# Patient Record
Sex: Male | Born: 1975 | Race: White | Hispanic: No | Marital: Married | Smoking: Former smoker
Health system: Southern US, Community
[De-identification: ages and names within clinical notes are randomized; demographics above are authoritative.]

## PROBLEM LIST (undated history)

## (undated) DIAGNOSIS — F319 Bipolar disorder, unspecified: Secondary | ICD-10-CM

## (undated) DIAGNOSIS — G4730 Sleep apnea, unspecified: Secondary | ICD-10-CM

## (undated) HISTORY — PX: UVULOPALATOPHARYNGOPLASTY: SHX827

## (undated) HISTORY — PX: VASECTOMY: SHX75

---

## 1998-08-04 ENCOUNTER — Emergency Department (HOSPITAL_COMMUNITY): Admission: EM | Admit: 1998-08-04 | Discharge: 1998-08-04 | Payer: Self-pay | Admitting: Emergency Medicine

## 1998-08-04 ENCOUNTER — Encounter: Payer: Self-pay | Admitting: Emergency Medicine

## 2000-04-17 ENCOUNTER — Emergency Department (HOSPITAL_COMMUNITY): Admission: EM | Admit: 2000-04-17 | Discharge: 2000-04-17 | Payer: Self-pay | Admitting: Emergency Medicine

## 2000-04-17 ENCOUNTER — Encounter: Payer: Self-pay | Admitting: Emergency Medicine

## 2003-03-03 ENCOUNTER — Emergency Department (HOSPITAL_COMMUNITY): Admission: EM | Admit: 2003-03-03 | Discharge: 2003-03-03 | Payer: Self-pay | Admitting: Emergency Medicine

## 2007-01-01 ENCOUNTER — Observation Stay (HOSPITAL_COMMUNITY): Admission: RE | Admit: 2007-01-01 | Discharge: 2007-01-02 | Payer: Self-pay | Admitting: Otolaryngology

## 2007-01-01 ENCOUNTER — Encounter (INDEPENDENT_AMBULATORY_CARE_PROVIDER_SITE_OTHER): Payer: Self-pay | Admitting: Otolaryngology

## 2007-03-22 ENCOUNTER — Emergency Department (HOSPITAL_COMMUNITY): Admission: EM | Admit: 2007-03-22 | Discharge: 2007-03-22 | Payer: Self-pay | Admitting: Emergency Medicine

## 2007-04-23 ENCOUNTER — Ambulatory Visit (HOSPITAL_BASED_OUTPATIENT_CLINIC_OR_DEPARTMENT_OTHER): Admission: RE | Admit: 2007-04-23 | Discharge: 2007-04-23 | Payer: Self-pay | Admitting: Otolaryngology

## 2007-04-24 ENCOUNTER — Ambulatory Visit: Payer: Self-pay | Admitting: Internal Medicine

## 2010-06-11 NOTE — Op Note (Signed)
NAME:  Trevor Rubio, Trevor Rubio              ACCOUNT NO.:  [id number]   MEDICAL RECORD NO.:  [id number]          PATIENT TYPE:  AMB   LOCATION:  SDS                          FACILITY:  [facility]   PHYSICIAN:  Trevor Rubio, M.D.DATE OF BIRTH:  1975-10-30   DATE OF PROCEDURE:  01/01/2007  DATE OF DISCHARGE:                               OPERATIVE REPORT   PREOPERATIVE DIAGNOSES/INDICATIONS FOR SURGERY:  1. Obstructive sleep apnea.  2. Tonsillar hypertrophy.  3. Uvula palatal hypertrophy.  4. Deviated nasal septum.  5. Inferior turbinate hypertrophy.   POSTOPERATIVE DIAGNOSES:  1. Obstructive sleep apnea.  2. Tonsillar hypertrophy.  3. Uvula palatal hypertrophy.  4. Deviated nasal septum.  5. Inferior turbinate hypertrophy.   SURGICAL PROCEDURES:  1. Uvulopalatopharyngoplasty.  2. Nasal septoplasty.  3. Tonsillectomy.  4. Inferior turbinate reduction.   SURGEON:  Dr. Annalee Rubio.   ANESTHESIA:  General endotracheal.   COMPLICATIONS:  None.   BLOOD LOSS:  100 mL.   No complications.  The patient transferred from the operating room to  recovery room in stable condition.   BRIEF HISTORY:  [name] is a 35 year old white male who has been  followed with a history of obstructive sleep apnea and airway  difficulty.  The patient had undergone prior sleep study which showed  mild to moderate sleep apnea.  He was unable to wear CPAP because of  severe nasal airway obstruction, facial pressure, headaches and  difficulty wearing the CPAP mask on a continuous basis.  Given the  patient's history and physical examination which showed a severely  deviated nasal septum, inferior turbinate hypertrophy and uvula palatal  and tonsillar hypertrophy, I recommended that we consider him for the  above surgical procedures for surgical management of obstructive sleep  apnea after failure of conservative medical management.  The patient  understood and concurred with our plan for surgery which was  scheduled  as an outpatient with overnight observation at [facility]  OR.  The risks, benefits, and possible complications of the surgical  procedures were discussed in detail with the patient.  He understood and  concurred with our plan for surgery which is scheduled as above.   SURGICAL PROCEDURE:  The patient was brought to the operating room on  January 01, 2007 at [facility] OR.  He was placed in the  supine position on the operating table.  General endotracheal anesthesia  was established without difficulty.  When the patient was adequately  anesthetized, he was positioned on the operating table and prepped and  draped in sterile fashion.  Surgical procedure was begun with nasal  examination and injection of 1% lidocaine 1:100,000 solution epinephrine  which was injected in the submucosal fashion along the nasal septum and  inferior turbinates bilaterally.  A total of 8 mL of local was injected,  and the patient's nose was then packed with Afrin-soaked cottonoid  pledgets which we left in place for approximately 10 minutes to offer  vasoconstriction and hemostasis.  The nasal cavity was then unpacked,  and the surgical procedure was begun by creating a left anterior  hemitransfixion incision.  This was carried through the mucosa and  underlying submucosa, and a mucoperichondrial flap was elevated from  anterior to posterior on the left-hand side.  The bony cartilaginous  junction was crossed in the midline and mucoperiosteal flap elevated on  the right.  Deviated bone and cartilage in the mid and posterior aspect  of the nasal septum were then mobilized.  Mid septal cartilage was  removed and later returned to the mucoperichondrial pocket after  morselization.  The patient had a very large inferior bony septal spur  which was mobilized with a 4-mm osteotome.  Dissection was then carried  out in the posterior aspect of the septum, removing bony  deviation.  Anterior, dorsal and columellar cartilage was not significantly  deviated.  This was left intact.  At the conclusion of the septal  portion of procedure, the mucoperichondrial flaps were reapproximated  with a 4-0 gut suture on a [name] in a horizontal mattress  fashion, and bilateral Doyle nasal septal splints were placed after the  application of Bactroban ointment and sutured in position with a 3-0  Ethilon suture.   Attention then turned to the patient's inferior turbinates where  bilateral inferior turbinate reduction was performed with the cautery  set to 12 watts.  Two submucosal passes were made in each inferior  turbinate.  The turbinates were cauterized in the setting of 12 watts,  and when the turbinates had been adequately cauterized, an anterior  incision was created, and a small amount of inferior turbinate bone was  resected preserving the overlying mucosa.  The turbinates were then  outfractured creating a more patent nasal cavity.   Attention was then turned to the oral cavity and oropharynx.  CroweEarlene Plater mouth gag was inserted without difficulty.  The patient's oral  cavity was examined.  He had significant tonsillar hypertrophy.  Tonsil  was resected using Bovie electrocautery, dissecting in subcapsular  fashion.  The entire left tonsil was removed from superior pole to  tongue base.  Right tonsil was removed in a similar fashion, and tonsil  tissue was sent to pathology for gross and microscopic evaluation.  Tonsillar fossae were gently abraded with a dry tonsil sponge, and  several areas of point hemorrhage were cauterized with Bovie suction  cautery.  There was no active bleeding.  The tonsil was removed and  tissue sent to pathology.  Attention then turned to palate.   Uvulopalatopharyngoplasty was performed using Bovie electrocautery  dissecting along the anterior aspect of the palate from the left  anterior pillar to the right.  The  anterior mucosal incision was  created.  The incision was then carried through mucosa, submucosa and  muscular layer of the palate, preserving the posterior palatal mucosa  for closure and reconstruction.  An approximately 1-cm strip of tissue  was removed along the palatal margin including entire uvula.  This  tissue was also sent to pathology.  The reconstruction was undertaken  with a 4-0 Vicryl suture on a tapered needle.  Horizontal mattress  running sutures were then placed laterally to advance the posterior  tonsillar pillars and closed the tonsillar fossa.  Multiple sutures were  then carried out along the palatal margin reapproximating the posterior  palatal mucosa and closing the surgical incision along the anterior  palate.  The patient's nasal cavity, nasopharynx, oral cavity and  oropharynx were then irrigated and suctioned.  There was no  active bleeding.  The patient had a widely patent airway.  An orogastric  tube was passed, and the stomach contents were aspirated.  The patient  was then awakened from his anesthetic, extubated and was transferred  from the operating room to the recovery in stable condition.  No  complications.  Blood loss was less than 100 mL.           ______________________________  Kinnie Scales. Annalee Rubio, M.D.     DLS/MEDQ  D:  25/36/6440  T:  01/01/2007  Job:  [id number]

## 2010-06-11 NOTE — Procedures (Signed)
NAME:  Trevor Rubio, Trevor Rubio              ACCOUNT NO.:  [id number]   MEDICAL RECORD NO.:  [id number]          PATIENT TYPE:  OUT   LOCATION:  SLEEP CENTER                 FACILITY:  [facility]   PHYSICIAN:  Clinton D. Maple Hudson, MD, FCCP, FACPDATE OF BIRTH:  1975/09[birth year]25   DATE OF STUDY:  04/23/2007                            NOCTURNAL POLYSOMNOGRAM   REFERRING PHYSICIAN:  Onalee Hua L. Annalee Genta, M.D.   INDICATIONS FOR PROCEDURE:  Hypersomnia with sleep apnea. Postoperative  evaluation.   RESULTS:  Epward sleepiness score 6/24. BMI 31. Weight 205 pounds.  Height 68 inches. Neck 18 inches.   MEDICATIONS:  No home medications charted.   SLEEP ARCHITECTURE:  Total sleep time 251 minutes with sleep efficiency  69.5%. Stage 1 was 9.8%; Stage 2, 73.3%; Stage 3, 3%; REM 13.9% of total  sleep time. Sleep latency 28.5 minutes. REM latency 178.5 minutes. Awake  after sleep onset 81 minutes. Arousal index 6.7. No bedtime medication  was taken.   RESPIRATORY DATA:  Apnea hypopnea index (AHI) 2.2 per hour, which is  normal. A total of 9 events were scored, all hypopnea's. Events were not  positional.  REM AHI 3.4.   OXYGEN DATA:  Mild snoring with oxygen desaturation to a nadir of 88%.  Mean oxygen saturation through the study was 94.3% on room air.   CARDIAC DATA:  Normal sinus rhythm.   MOVEMENT/PARASOMNIA:  Periodic limb movement with arousal, 46 events,  for an index of 11 per hour. Bathroom x1.   IMPRESSION/RECOMMENDATION:  1. Occasional respiratory event, AHI 2.2 per hour, which is normal      (normal range 0 to 5 per hour.) Non-positional events, mild snoring      with oxygen desaturation transiently to a nadir of 88%.  2. Periodic limb movement with arousal, 11 per hour.      Clinton D. Maple Hudson, MD, [facility], FACP  Diplomate, Biomedical engineer of Sleep Medicine  Electronically Signed    CDY/MEDQ  D:  04/24/2007 12:34:13  T:  04/24/2007 14:45:49  Job:  [id number]

## 2010-10-21 LAB — BASIC METABOLIC PANEL
BUN: 14
CO2: 27
Calcium: 9.7
Chloride: 105
Creatinine, Ser: 1.12
GFR calc Af Amer: >60
GFR calc non Af Amer: >60
Glucose, Bld: 98
Potassium: 4.3
Sodium: 139

## 2010-10-21 LAB — POCT CARDIAC MARKERS
CKMB, poc: 1.5
Myoglobin, poc: 102
Operator id: 4295
Troponin i, poc: <0.05

## 2010-11-04 LAB — BASIC METABOLIC PANEL
CO2: 27
Calcium: 10.1
Creatinine, Ser: 1.17
GFR calc Af Amer: >60
GFR calc non Af Amer: >60
Sodium: 138

## 2010-11-04 LAB — CBC
Hemoglobin: 15.7
MCHC: 34.8
RBC: 5
WBC: 7.6

## 2012-04-14 ENCOUNTER — Other Ambulatory Visit: Payer: Self-pay | Admitting: Family Medicine

## 2012-04-14 DIAGNOSIS — M4312 Spondylolisthesis, cervical region: Secondary | ICD-10-CM

## 2012-04-23 ENCOUNTER — Other Ambulatory Visit: Payer: Self-pay | Admitting: Family Medicine

## 2012-04-23 ENCOUNTER — Ambulatory Visit
Admission: RE | Admit: 2012-04-23 | Discharge: 2012-04-23 | Disposition: A | Discharge status: Home / Self Care | Payer: No Typology Code available for payment source | Source: Ambulatory Visit | Attending: Family Medicine | Admitting: Family Medicine

## 2012-04-23 DIAGNOSIS — T7589XD Other specified effects of external causes, subsequent encounter: Secondary | ICD-10-CM

## 2012-04-23 DIAGNOSIS — M5412 Radiculopathy, cervical region: Secondary | ICD-10-CM

## 2012-04-23 DIAGNOSIS — M4312 Spondylolisthesis, cervical region: Secondary | ICD-10-CM

## 2013-10-16 ENCOUNTER — Emergency Department (HOSPITAL_COMMUNITY)
Admission: EM | Admit: 2013-10-16 | Discharge: 2013-10-16 | Disposition: A | Discharge status: Home / Self Care | Payer: BC Managed Care – PPO | Attending: Emergency Medicine | Admitting: Emergency Medicine

## 2013-10-16 ENCOUNTER — Encounter (HOSPITAL_COMMUNITY): Payer: Self-pay | Admitting: Emergency Medicine

## 2013-10-16 ENCOUNTER — Emergency Department (HOSPITAL_COMMUNITY): Payer: BC Managed Care – PPO

## 2013-10-16 DIAGNOSIS — S4980XA Other specified injuries of shoulder and upper arm, unspecified arm, initial encounter: Secondary | ICD-10-CM | POA: Insufficient documentation

## 2013-10-16 DIAGNOSIS — Z79899 Other long term (current) drug therapy: Secondary | ICD-10-CM | POA: Insufficient documentation

## 2013-10-16 DIAGNOSIS — M25512 Pain in left shoulder: Secondary | ICD-10-CM

## 2013-10-16 DIAGNOSIS — S46909A Unspecified injury of unspecified muscle, fascia and tendon at shoulder and upper arm level, unspecified arm, initial encounter: Secondary | ICD-10-CM | POA: Diagnosis present

## 2013-10-16 DIAGNOSIS — Y9389 Activity, other specified: Secondary | ICD-10-CM | POA: Diagnosis not present

## 2013-10-16 DIAGNOSIS — T07XXXA Unspecified multiple injuries, initial encounter: Secondary | ICD-10-CM

## 2013-10-16 DIAGNOSIS — IMO0002 Reserved for concepts with insufficient information to code with codable children: Secondary | ICD-10-CM | POA: Diagnosis not present

## 2013-10-16 DIAGNOSIS — Y92410 Unspecified street and highway as the place of occurrence of the external cause: Secondary | ICD-10-CM | POA: Insufficient documentation

## 2013-10-16 MED ORDER — HYDROCODONE-ACETAMINOPHEN 5-325 MG PO TABS: ORAL_TABLET | ORAL | Status: DC

## 2013-10-16 MED ORDER — BACITRACIN ZINC 500 UNIT/GM EX OINT
1.0000 "application " | TOPICAL_OINTMENT | Freq: Once | CUTANEOUS | Status: AC
  Administered 2013-10-16: 1 via TOPICAL

## 2013-10-16 MED ORDER — METHOCARBAMOL 1000 MG/10ML IJ SOLN: 1000.0000 mg | Freq: Once | INTRAMUSCULAR | Status: DC

## 2013-10-16 MED ORDER — METHOCARBAMOL 500 MG PO TABS
1000.0000 mg | ORAL_TABLET | Freq: Once | ORAL | Status: AC
  Administered 2013-10-16: 1000 mg via ORAL
  Filled 2013-10-16: qty 2

## 2013-10-16 NOTE — Discharge Instructions (Signed)
For pain control please take ibuprofen (also known as Motrin or Advil)  (this is normally 4 over the counter pills) 3 times a day  for 5 days. Take with food to minimize stomach irritation.  Take vicodin for breakthrough pain, do not drink alcohol, drive, care for children or do other critical tasks while taking vicodin.  Wash the affected area with soap and water and apply a thin layer of topical antibiotic ointment. Do this every 12 hours.   Do not use rubbing alcohol or hydrogen peroxide.                        Look for signs of infection: if you see redness, if the area becomes warm, if pain increases sharply, there is discharge (pus), if red streaks appear or you develop fever or vomiting, RETURN immediately to the Emergency Department  for a recheck.   Only use the arm sling for up to 2 days. Take the arm out and rotate the shoulder every 4 hours.   Do not hesitate to return to the emergency room for any new, worsening or concerning symptoms.  Please obtain primary care using resource guide below. But the minute you were seen in the emergency room and that they will need to obtain records for further outpatient management.     Emergency Department Resource Guide 1) Find a Doctor and Pay Out of Pocket Although you won't have to find out who is covered by your insurance plan, it is a good idea to ask around and get recommendations. You will then need to call the office and see if the doctor you have chosen will accept you as a new patient and what types of options they offer for patients who are self-pay. Some doctors offer discounts or will set up payment plans for their patients who do not have insurance, but you will need to ask so you aren't surprised when you get to your appointment.  2) Contact Your Local Health Department Not all health departments have doctors that can see patients for sick visits, but many do, so it is worth a call to see if yours does. If you don't know  where your local health department is, you can check in your phone book. The CDC also has a tool to help you locate your state's health department, and many state websites also have listings of all of their local health departments.  3) Find a Walk-in Clinic If your illness is not likely to be very severe or complicated, you may want to try a walk in clinic. These are popping up all over the country in pharmacies, drugstores, and shopping centers. They're usually staffed by nurse practitioners or physician assistants that have been trained to treat common illnesses and complaints. They're usually fairly quick and inexpensive. However, if you have serious medical issues or chronic medical problems, these are probably not your best option.  No Primary Care Doctor: - Call Health Connect at  [phone] - they can help you locate a primary care doctor that  accepts your insurance, provides certain services, etc. - Physician Referral Service- [phone]  Chronic Pain Problems: Organization         Address  Phone   Notes  [facility]  [phone] Patients need to be referred by their primary care doctor.   Medication Assistance: Organization         Address  Phone   Notes  [facility] Medication  Assistance Program [address] [place] [place], [place] [address] [phone] --Must be a resident of [facility] -- Must have NO insurance coverage whatsoever (no Medicaid/ Medicare, etc.) -- The pt. MUST have a primary care doctor that directs their care regularly and follows them in the community   [facility]  [phone]   [employer]  [phone]    Agencies that provide inexpensive medical care: Organization         Address  Phone   Notes  [facility]  [phone]   [facility]    [phone]   [facility] [address] [place], [place] [address] [phone]   [facility]  [address] [place] [phone]   Planned Parenthood    [phone]   [facility]    [phone]   [facility]  [address], [place] Phone:  [phone], Fax:  [phone] Hours of Operation:  9 am - 6 pm, M-F.  Also accepts Medicaid/Medicare and self-pay.  [facility]  [address], [place] Phone: [phone], Fax: [phone]. Hours of Operation:  8:30 am - 5:30 pm, M-F.  Also accepts Medicaid and self-pay.  [facility] [place] [address] [place] Phone: [phone]   [facility] [address] Natasha Bence [place], [place] [phone], Ext. [phone] Mondays & Thursdays: 7-9 AM.  First 15 patients are seen on a first come, first serve basis.    Medicaid-accepting [facility] Providers:  Organization         Address  Phone   Notes  [facility] [address], [place] [phone] Also accepts self-pay patients.  [facility] [address] Laurell Josephs [place], [place]  [phone]   [facility] [address], [place] [phone]   [facility] [address] [place] [phone]   Renaye Rakers [address], [place]   [phone] Only accepts [place] Access [place] patients after they have their name applied to their card.   Self-Pay (no insurance) in [facility]:  Organization         Address  Phone   Notes  Sickle Cell Patients, [facility] [address] [place], [place] [phone]   [facility] [address] [place], [place] [phone]   [facility] [place]  [address], [place] [phone]   [facility]/Dr. Osei-Bonsu  [address] [place] or [address], [place] [phone] Phone number for both  [place] and [place] locations is the same.  [facility] and [facility] [address] [place] [phone]   [facility] [address] [place] or [address] [phone]   [facility] [address] [place] [phone], phone; [phone], fax Sees patients 1st and 3rd Saturday of every month.  Must not qualify for public or private insurance (i.e. Medicaid, Medicare,  Health Choice, Veterans' Benefits)  Household income should be no more than 200% of the poverty level The clinic cannot treat you if you are pregnant or think you are pregnant  Sexually transmitted diseases are not treated at  the clinic.    Dental Care: Organization         Address  Phone  Notes  [facility] Department of [facility] [address] [place], [place] [phone] Accepts children up to age 42 who are enrolled in [place] or [place] Health Choice; pregnant women with a Medicaid card; and children who have applied for Medicaid or [place] Health Choice, but were declined, whose parents can pay a reduced fee at time of service.  [facility] Department of [facility]  [address], [place] [phone] Accepts children up to age 75 who are enrolled in [place] or [place] Health Choice; pregnant women with a Medicaid card; and children who have applied for Medicaid or [place] Health Choice, but were declined, whose parents can pay a reduced fee at time of service.  [facility]  [address] [place], [place] [phone] Patients are seen by appointment only. Walk-ins are not accepted. [facility] will see patients 42 years of age and older. Monday - Tuesday (8am-5pm) Most Wednesdays (8:30-5pm) $30 per visit, cash only  [facility]  [address], [facility] [phone] Patients are seen by appointment only. Walk-ins are not  accepted. [facility] will see patients 52 years of age and older. One Wednesday Evening (Monthly: Volunteer Based).  $30 per visit, cash only  [employer]  [phone] for adults; Children under age 66, call Graduate Pediatric Dentistry at [phone]. Children aged 44-14, please call [phone] to request a pediatric application.  Dental services are provided in all areas of dental care including fillings, crowns and bridges, complete and partial dentures, implants, gum treatment, root canals, and extractions. Preventive care is also provided. Treatment is provided to both adults and children. Patients are selected via a lottery and there is often a waiting list.   [facility] [address], [place]  [phone] [email or web address]   [facility] [address] [place], [place] [phone], Ext. [phone] Second and Fourth Thursday of each month, opens at 6:30 AM; Clinic ends at 9 AM.  Patients are seen on a first-come first-served basis, and a limited number are seen during each clinic.   [facility]  [address] Ether [place]ins [place], [place] [phone]   Eligibility Requirements You must have lived in [place], [place], or [place] counties for at least the last three months.   You cannot be eligible for state or federal sponsored [place], including [employer], [place], or [employer].   You generally cannot be eligible for healthcare insurance through your employer.    How to apply: Eligibility screenings are held every Tuesday and Wednesday afternoon from 1:00 pm until 4:00 pm. You do not need an appointment for the interview!  [facility] [address] [place], [place] [phone]   [facility] Health Department  [phone]   [facility] Health Department  [phone]   [facility] Health Department  [phone]    Behavioral Health Resources in the  Community: Intensive Outpatient Programs Organization         Address  Phone  Notes  [facility] Services [address] [place], [place] [phone]   [facility] Outpatient [address], [place], [place] [phone]   [facility] [address], [place], [place]  [phone]   [facility] Mental  Health [address],  [place], [place] [phone] or [phone]   Substance Abuse Resources Organization         Address  Phone  Notes  [facility]  [phone]   [facility]  [phone]   [facility] [place]  [phone]   Floydene Flock  [phone]   Residential & Outpatient Substance Abuse Program  [phone]   Psychological Services Organization         Address  Phone  Notes  [facility] Health  [phone]   [facility]  [phone]   [facility] Mental Health [address] [place] [phone] or [phone]    Mobile Crisis Teams Organization         Address  Phone  Notes  [facility], Mobile Crisis Care Unit  [phone]   [facility]  [address] [place], [place] [phone]   Doristine Locks [address] [place] [phone]    Self-Help/Support Groups Organization         Address  Phone             Notes  [facility] [place] - variety of support groups  336- I7437963 Call for more information  Narcotics Anonymous (NA), [facility] [address], [employer] [place]  2 meetings at this location   Statistician         Address  Phone  Notes  [facility] [address],    [place]  [phone]   [facility]  [address], [place] [address], [place], [place] [phone]   [facility] [address] [place], [place] [phone] Admissions: 8am-3pm M-F  [facility] [address]    [place], [place] [phone]   [facility] [address] [place], [place], [place] [phone]   [facility] [address],  [place], [place] [phone]   [facility] - Intensive Outpatient [address] Laurell Josephs [address], [place], [place] [phone]   [facility] ([facility] [address] [place]  [employer], [place] [phone] or [phone]   [facility] ([facility]) [address] [place], [place] [phone] Accepts Medicaid  [facility] [address]  [place] [phone] Substance Abuse/Addiction Treatment   [facility] Organization         Address  Phone  Notes  [facility]  [phone]   Angie Fava, PhD [address] Ervin Knack [place], [place]   [phone] or [phone]   [facility]al   [address] [place], [place] [phone]   [facility] [address] [place], [place] [phone] Insurance/Medicaid/sponsorship through [facility] and Families [address]                                    [place], [place] [phone] Therapy/tele-psych/case  [facility] [address], [place] [phone]    Dr. Lolly Mustache  [phone]   [facility]  [employer] [facility] Dept. 1) [address], [place] 2) [address], [place] 3)  [address], [place] [phone]  [phone]   [facility] Child Abuse Hotline (979) 435-3672 or [phone] (After Hours)
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
## 2013-10-16 NOTE — ED Notes (Signed)
Abrasions cleansed on left arm.
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
## 2013-10-17 NOTE — ED Provider Notes (Signed)
Medical screening examination/treatment/procedure(s) were performed by non-physician practitioner and as supervising physician I was immediately available for consultation/collaboration.   Candyce Churn III, MD 10/17/13 [phone]

## 2014-07-29 ENCOUNTER — Emergency Department (HOSPITAL_COMMUNITY)
Admission: EM | Admit: 2014-07-29 | Discharge: 2014-07-29 | Disposition: A | Discharge status: Home / Self Care | Payer: BLUE CROSS/BLUE SHIELD | Attending: Emergency Medicine | Admitting: Emergency Medicine

## 2014-07-29 ENCOUNTER — Encounter (HOSPITAL_COMMUNITY): Payer: Self-pay | Admitting: Emergency Medicine

## 2014-07-29 DIAGNOSIS — R509 Fever, unspecified: Secondary | ICD-10-CM | POA: Insufficient documentation

## 2014-07-29 DIAGNOSIS — B86 Scabies: Secondary | ICD-10-CM | POA: Insufficient documentation

## 2014-07-29 DIAGNOSIS — Z79899 Other long term (current) drug therapy: Secondary | ICD-10-CM | POA: Diagnosis not present

## 2014-07-29 DIAGNOSIS — R21 Rash and other nonspecific skin eruption: Secondary | ICD-10-CM | POA: Diagnosis present

## 2014-07-29 DIAGNOSIS — Z8669 Personal history of other diseases of the nervous system and sense organs: Secondary | ICD-10-CM | POA: Diagnosis not present

## 2014-07-29 HISTORY — DX: Sleep apnea, unspecified: G47.30

## 2014-07-29 MED ORDER — KETOROLAC TROMETHAMINE 60 MG/2ML IM SOLN
30.0000 mg | Freq: Once | INTRAMUSCULAR | Status: AC
  Administered 2014-07-29: 30 mg via INTRAMUSCULAR
  Filled 2014-07-29: qty 2

## 2014-07-29 MED ORDER — PERMETHRIN 5 % EX CREA: TOPICAL_CREAM | CUTANEOUS | Status: DC

## 2014-07-29 NOTE — ED Notes (Signed)
Pt verbalizes understanding of d/c instructions and denies any further needs at this time. 

## 2014-07-29 NOTE — Discharge Instructions (Signed)
Return to the emergency room with worsening of symptoms, new symptoms or with symptoms that are concerning. Read below information and follow recommendations.  Scabies Scabies are small bugs (mites) that burrow under the skin and cause red bumps and severe itching. These bugs can only be seen with a microscope. Scabies are highly contagious. They can spread easily from person to person by direct contact. They are also spread through sharing clothing or linens that have the scabies mites living in them. It is not unusual for an entire family to become infected through shared towels, clothing, or bedding.  HOME CARE INSTRUCTIONS   Your caregiver may prescribe a cream or lotion to kill the mites. If cream is prescribed, massage the cream into the entire body from the neck to the bottom of both feet. Also massage the cream into the scalp and face if your child is less than 38 year old. Avoid the eyes and mouth. Do not wash your hands after application.  Leave the cream on for 8 to 12 hours. Your child should bathe or shower after the 8 to 12 hour application period. Sometimes it is helpful to apply the cream to your child right before bedtime.  One treatment is usually effective and will eliminate approximately 95% of infestations. For severe cases, your caregiver may decide to repeat the treatment in 1 week. Everyone in your household should be treated with one application of the cream.  New rashes or burrows should not appear within 24 to 48 hours after successful treatment. However, the itching and rash may last for 2 to 4 weeks after successful treatment. Your caregiver may prescribe a medicine to help with the itching or to help the rash go away more quickly.  Scabies can live on clothing or linens for up to 3 days. All of your child's recently used clothing, towels, stuffed toys, and bed linens should be washed in hot water and then dried in a dryer for at least 20 minutes on high heat. Items that  cannot be washed should be enclosed in a plastic bag for at least 3 days.  To help relieve itching, bathe your child in a cool bath or apply cool washcloths to the affected areas.  Your child may return to school after treatment with the prescribed cream. SEEK MEDICAL CARE IF:   The itching persists longer than 4 weeks after treatment.  The rash spreads or becomes infected. Signs of infection include red blisters or yellow-tan crust. Document Released: 01/13/2005 Document Revised: 04/07/2011 Document Reviewed: 05/24/2008 [facility] Patient Information 2015 [place] [place]. This information is not intended to replace advice given to you by your health care provider. Make sure you discuss any questions you have with your health care provider.

## 2014-07-29 NOTE — ED Provider Notes (Signed)
CSN: [id number]     Arrival date & time 07/29/14  1959 History  This chart was scribed for non-physician practitioner, Oswaldo ConroyVictoria Erline Siddoway, PA-C, working with Rolland PorterMark James, MD, by Ronney LionSuzanne Le, ED Scribe. This patient was seen in room TR10C/TR10C and the patient's care was started at 9:03 PM.    Chief Complaint  Patient presents with  . Rash   The history is provided by the patient. No language interpreter was used.    HPI Comments: Trevor Rubio is a 39 y.o. male who presents to the Emergency Department complaining of a burning, nonpruritic rash to his bilateral hands and bilateral feet. He also complains of an associated pins and needles sensation to the affected areas and endorses a fever of about 100 last night, with some chills. Patient states he was exposed to the sun for a short period of time 2 days ago. He also reports a tick bite to his abdomen 3 weeks ago, but denies any bites since then. He denies any new lotions, detergents, soaps, clothes, medications or any recent changes in medications. He has never had a problem like this before. Patient has taken Aleve and Tylenol for his pain, with no relief. He denies SOB, difficulty breathing, nausea, or vomiting.   Past Medical History  Diagnosis Date  . Sleep apnea    Past Surgical History  Procedure Laterality Date  . Uvulopalatopharyngoplasty    . Vasectomy     No family history on file. History  Substance Use Topics  . Smoking status: Never Smoker   . Smokeless tobacco: Not on file  . Alcohol Use: No    Review of Systems  Constitutional: Positive for fever (of 100, per pt) and chills (mild).  Respiratory: Negative for shortness of breath and wheezing.   Gastrointestinal: Negative for nausea and vomiting.  Skin: Positive for rash.    Allergies  Review of patient's allergies indicates no known allergies.  Home Medications   Prior to Admission medications   Medication Sig Start Date End Date Taking? Authorizing Provider   clonazePAM (KLONOPIN) 0.5 MG tablet Take 0.5 mg by mouth daily.    Historical Provider, MD  escitalopram (LEXAPRO) 20 MG tablet Take 20 mg by mouth daily.    Historical Provider, MD  HYDROcodone-acetaminophen (NORCO/VICODIN) 5-325 MG per tablet Take 1-2 tablets by mouth every 6 hours as needed for pain. 10/16/13   Nicole Pisciotta, PA-C  permethrin (ELIMITE) 5 % cream Apply to affected area head to toe and leave on for -14 hours and wash off with water. 07/29/14   Oswaldo ConroyVictoria Tylisha Danis, PA-C   BP 130/84 mmHg  Pulse 73  Temp(Src) 99.2 F (37.3 C) (Oral)  Resp 16  Ht 5\' 8"  (1.727 m)  Wt 229 lb (103.874 kg)  BMI 34.83 kg/m2  SpO2 98% Physical Exam  Constitutional: He appears well-developed and well-nourished. No distress.  HENT:  Head: Normocephalic and atraumatic.  Eyes: Conjunctivae and EOM are normal. Right eye exhibits no discharge. Left eye exhibits no discharge.  Cardiovascular: Normal rate and regular rhythm.   Pulmonary/Chest: Effort normal and breath sounds normal. No respiratory distress. He has no wheezes.  Abdominal: Soft. Bowel sounds are normal. He exhibits no distension. There is no tenderness.  Neurological: He is alert. He exhibits normal muscle tone. Coordination normal.  5/5 strength in bilateral upper and lower extremities.. Sensation intact. Normal Gait.  Skin: Skin is warm and dry. He is not diaphoretic.  Small erythematous bite marks concentrated in left interdigital webbing of hand,  without signs of cellulitis, to palms bilaterally. None to abdomen  Nursing note and vitals reviewed.   ED Course  Procedures (including critical care time)  DIAGNOSTIC STUDIES: Oxygen Saturation is 98% on RA, normal by my interpretation.    COORDINATION OF CARE: 9:05 PM - Suspect exposure to scabies. Discussed treatment plan with pt at bedside which includes permetherin cream and OTC pain relief as needed. Advised to launder any linens in hot water. Pt verbalized understanding and agreed  to plan.   MDM   Final diagnoses:  Scabies   Discussed diagnosis & treatment of scabies.  They have been advised to followup with urgent care or PCP  1 weeks after treatment.  They have also been advised to clean entire household including washing sheets and using R.I.D. spray in the car and on sofa.   The use of permethrin cream was discussed as well, they were told to use cream from head to toe & leave on child and parents for 8-12 hours.  They've been advised to repeat treatment if new eruptions occur. Pt verbalized understanding.   I personally performed the services described in this documentation, which was scribed in my presence. The recorded information has been reviewed and is accurate.   Oswaldo Conroy, PA-C 07/29/14 2122  Rolland Porter, MD 08/03/14 0001

## 2014-07-29 NOTE — ED Notes (Signed)
Pt. woke up this morning with itchy rashes at hands and abdomen , also reported hands and feet tingling .

## 2014-09-04 ENCOUNTER — Encounter (HOSPITAL_COMMUNITY): Payer: Self-pay | Admitting: Emergency Medicine

## 2014-09-04 ENCOUNTER — Emergency Department (HOSPITAL_COMMUNITY)
Admission: EM | Admit: 2014-09-04 | Discharge: 2014-09-04 | Disposition: A | Discharge status: Home / Self Care | Payer: BLUE CROSS/BLUE SHIELD | Attending: Emergency Medicine | Admitting: Emergency Medicine

## 2014-09-04 ENCOUNTER — Emergency Department (HOSPITAL_COMMUNITY)
Admission: EM | Admit: 2014-09-04 | Discharge: 2014-09-05 | Disposition: A | Discharge status: Home / Self Care | Payer: BLUE CROSS/BLUE SHIELD | Attending: Emergency Medicine | Admitting: Emergency Medicine

## 2014-09-04 DIAGNOSIS — Z79899 Other long term (current) drug therapy: Secondary | ICD-10-CM | POA: Diagnosis not present

## 2014-09-04 DIAGNOSIS — Z8669 Personal history of other diseases of the nervous system and sense organs: Secondary | ICD-10-CM | POA: Diagnosis not present

## 2014-09-04 DIAGNOSIS — R42 Dizziness and giddiness: Secondary | ICD-10-CM | POA: Diagnosis not present

## 2014-09-04 DIAGNOSIS — R454 Irritability and anger: Secondary | ICD-10-CM

## 2014-09-04 DIAGNOSIS — Z8659 Personal history of other mental and behavioral disorders: Secondary | ICD-10-CM | POA: Diagnosis not present

## 2014-09-04 DIAGNOSIS — Z760 Encounter for issue of repeat prescription: Secondary | ICD-10-CM | POA: Diagnosis not present

## 2014-09-04 DIAGNOSIS — R451 Restlessness and agitation: Secondary | ICD-10-CM | POA: Diagnosis not present

## 2014-09-04 HISTORY — DX: Bipolar disorder, unspecified: F31.9

## 2014-09-04 LAB — CBC
HEMATOCRIT: 44.6 % (ref 39.0–52.0)
Hemoglobin: 15.8 g/dL (ref 13.0–17.0)
MCH: 31.5 pg (ref 26.0–34.0)
MCHC: 35.4 g/dL (ref 30.0–36.0)
MCV: 88.8 fL (ref 78.0–100.0)
PLATELETS: 294 10*3/uL (ref 150–400)
RBC: 5.02 MIL/uL (ref 4.22–5.81)
RDW: 12.4 % (ref 11.5–15.5)
WBC: 9.3 10*3/uL (ref 4.0–10.5)

## 2014-09-04 LAB — BASIC METABOLIC PANEL
Anion gap: 9 (ref 5–15)
BUN: 14 mg/dL (ref 6–20)
CALCIUM: 9.6 mg/dL (ref 8.9–10.3)
CO2: 25 mmol/L (ref 22–32)
CREATININE: 1.18 mg/dL (ref 0.61–1.24)
Chloride: 104 mmol/L (ref 101–111)
GFR calc Af Amer: >60 mL/min (ref >60)
Glucose, Bld: 120 mg/dL — ABNORMAL HIGH (ref 65–99)
Potassium: 3.9 mmol/L (ref 3.5–5.1)
SODIUM: 138 mmol/L (ref 135–145)

## 2014-09-04 LAB — URINALYSIS, ROUTINE W REFLEX MICROSCOPIC
BILIRUBIN URINE: NEGATIVE
Glucose, UA: NEGATIVE mg/dL
Hgb urine dipstick: NEGATIVE
Ketones, ur: NEGATIVE mg/dL
Leukocytes, UA: NEGATIVE
NITRITE: NEGATIVE
PROTEIN: NEGATIVE mg/dL
SPECIFIC GRAVITY, URINE: 1.016 (ref 1.005–1.030)
UROBILINOGEN UA: 0.2 mg/dL (ref 0.0–1.0)
pH: 6.5 (ref 5.0–8.0)

## 2014-09-04 LAB — I-STAT TROPONIN, ED: TROPONIN I, POC: 0.01 ng/mL (ref 0.00–0.08)

## 2014-09-04 LAB — CBG MONITORING, ED: GLUCOSE-CAPILLARY: 106 mg/dL — AB (ref 65–99)

## 2014-09-04 MED ORDER — DIAZEPAM 5 MG PO TABS
5.0000 mg | ORAL_TABLET | Freq: Once | ORAL | Status: AC
  Administered 2014-09-04: 5 mg via ORAL
  Filled 2014-09-04: qty 1

## 2014-09-04 NOTE — ED Provider Notes (Signed)
CSN: [id number]     Arrival date & time 09/04/14  1958 History   First MD Initiated Contact with Patient 09/04/14 2224     Chief Complaint  Patient presents with  . Dizziness     (Consider location/radiation/quality/duration/timing/severity/associated sxs/prior Treatment) HPI Trevor Rubio is a 39 y.o. male history of bipolar disorder, presents to emergency department complaining of dizziness. Patient states that his dizziness started 2 days ago. He states it feels like "things around her spinning and my brain is foggy." Patient denies ever feeling this way in the past. Patient states symptoms have been constant for the last 2 days, but vary in severity. Patient states his doctor recently took him off of his psychiatric medications. Patient states they took him off Lexapro, Wellbutrin, lithium. Patient states that he last took them 6 weeks ago. He states that those medications were tapered down by his doctor. Patient denies a headache. No nasal congestion, no ear pain, no tinnitus. He denies any shortness of breath but does state that he has had some chest pressure. He denies any recent travel or surgeries. He denies lower extremity edema. No numbness or weakness in extremities. Patient went to [facility] earlier, and gave them a completely different story, based on the note that I read. I addressed that with the patient and said that I saw the note and it clearly states that patient went there because he wanted a refill of his medications and he said that his psychiatrist was out of town. Patient denied this.  Past Medical History  Diagnosis Date  . Sleep apnea   . Bipolar depression    Past Surgical History  Procedure Laterality Date  . Uvulopalatopharyngoplasty    . Vasectomy     No family history on file. History  Substance Use Topics  . Smoking status: Never Smoker   . Smokeless tobacco: Not on file  . Alcohol Use: No    Review of Systems  Constitutional: Negative for fever  and chills.  Eyes: Negative for photophobia.  Respiratory: Negative for cough, chest tightness and shortness of breath.   Cardiovascular: Negative for chest pain, palpitations and leg swelling.  Gastrointestinal: Negative for nausea, vomiting, abdominal pain, diarrhea and abdominal distention.  Genitourinary: Negative for dysuria, urgency, frequency and hematuria.  Musculoskeletal: Negative for myalgias, arthralgias, neck pain and neck stiffness.  Skin: Negative for rash.  Allergic/Immunologic: Negative for immunocompromised state.  Neurological: Positive for dizziness and light-headedness. Negative for weakness, numbness and headaches.  All other systems reviewed and are negative.     Allergies  Review of patient's allergies indicates no known allergies.  Home Medications   Prior to Admission medications   Not on File   BP 129/88 mmHg  Pulse 78  Temp(Src) 98.1 F (36.7 C)  Resp 20  Ht 5\' 8"  (1.727 m)  Wt 227 lb (102.967 kg)  BMI 34.52 kg/m2  SpO2 98% Physical Exam  Constitutional: He is oriented to person, place, and time. He appears well-developed and well-nourished. No distress.  HENT:  Head: Normocephalic and atraumatic.  Eyes: Conjunctivae and EOM are normal. Pupils are equal, round, and reactive to light.  Neck: Normal range of motion. Neck supple.  Cardiovascular: Normal rate, regular rhythm and normal heart sounds.   Pulmonary/Chest: Effort normal. No respiratory distress. He has no wheezes. He has no rales.  Abdominal: Soft. Bowel sounds are normal. He exhibits no distension. There is no tenderness. There is no rebound.  Musculoskeletal: He exhibits no edema.  Neurological:  He is alert and oriented to person, place, and time. No cranial nerve deficit. Coordination normal.  5/5 and equal upper and lower extremity strength bilaterally. Equal grip strength bilaterally. Normal finger to nose and heel to shin. No pronator drift. Patellar reflexes 2+   Skin: Skin is  warm and dry.  Nursing note and vitals reviewed.   ED Course  Procedures (including critical care time) Labs Review Labs Reviewed  BASIC METABOLIC PANEL - Abnormal; Notable for the following:    Glucose, Bld 120 (*)    All other components within normal limits  CBG MONITORING, ED - Abnormal; Notable for the following:    Glucose-Capillary 106 (*)    All other components within normal limits  CBC  URINALYSIS, ROUTINE W REFLEX MICROSCOPIC (NOT AT [facility])  URINE RAPID DRUG SCREEN, HOSP PERFORMED  I-STAT TROPOININ, ED    Imaging Review No results found.   EKG Interpretation None      MDM   Final diagnoses:  Vertigo    Patient is describing the vertigo-like symptoms. His neurological exam is normal. There is no nystagmus. Vital signs are normal. He is complaining of dizziness. He is telling me that his doctor actually tapered down his lithium, Wellbutrin Lexapro and he has not had it in 6 weeks. He gives me a completely different story from what he told the provider [facility] just a few hours ago. I will try some Valium, labs already resulted in unremarkable, we'll get orthostatics.  12:37 AM Patient states his dizziness has resolved. His blood work is unremarkable. Orthostatics still pending, just ask one of the techs and she will get them done. This is most likely vertigo. Given age and no reisk factors with completely resolved symptoms no concern for CVA.  Will discharge home on meclizine and valium. Follow up with pcp.   Filed Vitals:   09/04/14 2004  BP: 129/88  Pulse: 78  Temp: 98.1 F (36.7 C)  Resp: 20  Height:  (1.727 m)  Weight: 227 lb (102.967 kg)  SpO2: 98%     Jaynie Crumble, PA-C 09/05/14 0054  Mancel Bale, MD 09/06/14 1243

## 2014-09-04 NOTE — ED Notes (Signed)
Pt reports that 4 weeks ago he stopped a medication and now for the past 2 days he has felt dizziness and "foggy headed". sts this gets worse with movement. Seen at [facility] earlier for same.

## 2014-09-04 NOTE — Discharge Instructions (Signed)
Return to the Emergency Department immediately if you have thoughts of hurting self or others   Emergency Department Resource Guide 1) Find a Doctor and Pay Out of Pocket Although you won't have to find out who is covered by your insurance plan, it is a good idea to ask around and get recommendations. You will then need to call the office and see if the doctor you have chosen will accept you as a new patient and what types of options they offer for patients who are self-pay. Some doctors offer discounts or will set up payment plans for their patients who do not have insurance, but you will need to ask so you aren't surprised when you get to your appointment.  2) Contact Your Local Health Department Not all health departments have doctors that can see patients for sick visits, but many do, so it is worth a call to see if yours does. If you don't know where your local health department is, you can check in your phone book. The CDC also has a tool to help you locate your state's health department, and many state websites also have listings of all of their local health departments.  3) Find a Walk-in Clinic If your illness is not likely to be very severe or complicated, you may want to try a walk in clinic. These are popping up all over the country in pharmacies, drugstores, and shopping centers. They're usually staffed by nurse practitioners or physician assistants that have been trained to treat common illnesses and complaints. They're usually fairly quick and inexpensive. However, if you have serious medical issues or chronic medical problems, these are probably not your best option.  No Primary Care Doctor: - Call Health Connect at  [phone] - they can help you locate a primary care doctor that  accepts your insurance, provides certain services, etc. - Physician Referral Service- [phone]  Chronic Pain Problems: Organization         Address  Phone   Notes  [facility]   [phone] Patients need to be referred by their primary care doctor.   Medication Assistance: Organization         Address  Phone   Notes  [facility] Medication [facility] [address] [place] [place], [place] [address] [phone] --Must be a resident of [facility] -- Must have NO insurance coverage whatsoever (no Medicaid/ Medicare, etc.) -- The pt. MUST have a primary care doctor that directs their care regularly and follows them in the community   [facility]ist  [phone]   [employer]  [phone]    Agencies that provide inexpensive medical care: Organization         Address  Phone   Notes  [facility]  [phone]   [facility]    [phone]   [facility] [address] [place], [place] [address] [phone]   [facility] [address] [place] [place] [phone]   Planned Parenthood    [phone]   [facility]    [phone]   [facility]  [address], [place] Phone:  [phone], Fax:  [phone] Hours of Operation:  9 am - 6 pm, M-F.  Also accepts Medicaid/Medicare and self-pay.  [facility]  [address], [place] Phone: [phone], Fax: [phone]. Hours of Operation:  8:30 am - 5:30 pm, M-F.  Also accepts Medicaid and self-pay.  [facility] [address] [place] Phone: [phone]   [place], [place], [place] [phone], Ext. [phone] Mondays & Thursdays: 7-9 AM.  First 15 patients are seen on a first come, first serve basis.    [place] Providers:  Organization         Address  Phone   Notes  [facility] [address], [place] [phone] Also accepts self-pay patients.  [facility] [address], [place]  [phone]   [place], [address], [place] [phone]   [facility] [address], [place] [phone]   Lucianne Lei [address], [place]   [phone] Only accepts [place] Access [place] patients after they have their name applied to their card.   Self-Pay (no insurance) in [facility]:  Organization         Address  Phone   Notes  Sickle Cell Patients, [facility] [place] [phone]   [facility]gent Care [place] [phone]   [facility] [place]  [place], [place], [employer] [phone]   [facility]/Dr. Osei-Bonsu  [address] [place] or [address], [place] [phone] Phone number for both [place] and [place] locations is the same.  Urgent Medical and [facility] [address], [place] [phone]   [facility] [address] [place] or [address] [phone]   [facility] [address] [place] [phone], phone; [phone], fax Sees patients 1st and 3rd Saturday of every month.  Must not qualify for public or private insurance (i.e. Medicaid, Medicare, [place] Health Choice, Veterans' Benefits)  Household income should be no more than 200% of the poverty level The clinic cannot treat you if you are pregnant or think you are pregnant  Sexually transmitted diseases are not treated at the clinic.    Dental Care: Organization         Address  Phone  Notes  [facility] Department of [facility] [place] [phone] Accepts children up to age 10 who are enrolled in [place] or [place]; pregnant women with a Medicaid card; and children who have applied for Medicaid or [place] Health  Choice, but were declined, whose parents can pay a reduced fee at time of service.  [facility] Department of [facility]  [address], [place] [phone] Accepts children up to age 32 who are enrolled in [place] or [place]; pregnant women with a Medicaid card; and children who have applied for Medicaid or [place] Health Choice, but were declined, whose parents can pay a reduced fee at time of service.  [place] Adult [facility] Access PROGRAM  [place] [phone] Patients are seen by appointment only. Walk-ins are not accepted. [place] will see patients 78 years of age and older. Monday - Tuesday (8am-5pm) Most Wednesdays (8:30-5pm) $30 per visit, cash only  [facility] Adult Dental Access PROGRAM  [address], [facility] [phone] Patients are seen by appointment  only. Walk-ins are not accepted. [place] will see patients 69 years of age and older. One Wednesday Evening (Monthly: Volunteer Based).  $30 per visit, cash only  [place]  [phone] for adults; Children under age 5, call Graduate Pediatric Dentistry at [phone]. Children aged 58-14, please call [phone] to request a pediatric application.  Dental services are provided in all areas of dental care including fillings, crowns and bridges, complete and partial dentures, implants, gum treatment, root canals, and extractions. Preventive care is also provided. Treatment is provided to both adults and children. Patients are selected via a lottery and there is often a waiting list.   [facility] [address], [place]  [phone] [email or web address]   [facility] [address] [place], [place] [phone], Ext. [phone] Second and Fourth Thursday of each month, opens at 6:30 AM; Clinic ends at 9 AM.  Patients are seen on a first-come first-served basis, and a limited number are seen during each  clinic.   [facility]  [address] Hillard Danker [place], [place] [phone]   Eligibility Requirements You must have lived in [place], [place], or [place] counties for at least the last three months.   You cannot be eligible for state or federal sponsored [employer], including [employer], [place], or [employer].   You generally cannot be eligible for healthcare insurance through your employer.    How to apply: Eligibility screenings are held every Tuesday and Wednesday afternoon from 1:00 pm until 4:00 pm. You do not need an appointment for the interview!  [facility] [address] [place], [place]   [place]  [place] Department  [place]  [phone]    Behavioral Health Resources in the Community: Intensive Outpatient Programs Organization         Address  Phone  Notes  [place]. [address] [place], [place] [phone]   [facility] Outpatient [address], [place], [place]   ADS: [facility] [address] [place], [place]   [place] [address]  [place], [place] or [phone]   Substance Abuse Resources Organization         Address  Phone  Notes  Alcohol and Drug Services  [phone]   [place]  8025057661   [facility] [place]   Chinita Pester  [phone]   Residential & Outpatient Substance Abuse Program  [phone]   Psychological Services Organization         Address  Phone  Notes  [facility] [place]  [place]  [phone]   [place] [address] [place] or [phone]    Mobile Crisis Teams Organization         Address  Phone  Notes  [facility], Mobile  Crisis Care Unit  [phone]   [facility]  [address] [place], [place]   [place] [address] [place] [phone]    Self-Help/Support Groups Organization         Address  Phone             Notes  [place]. of  - variety of support groups  [place] Call for more information  Narcotics Anonymous (NA), [facility] [address], [employer] [place]  2 meetings at this location   Special educational needs teacher         Address  Phone  Notes  [facility] [place],    [place]  [phone]   [facility]  [address] [place] [address], [place], [place]   [place], [place] [phone] Admissions: 8am-3pm M-F  Incentives Substance [place] [address]    [place], [place] [phone]   [facility] [address] [place], [place], [place]   [facility] [address],  [place], [place]   [facility] - Intensive Outpatient [address], [place], [place]   [facility].,  [place], [place] [phone] or [phone]   [facility] ([facility]) [address] [place], [place] Accepts Medicaid  [place] [address],  [place] [phone] Substance Abuse/Addiction Treatment   [facility] Organization         Address  Phone  Notes  [facility]  [phone]   Domenic Schwab, PhD [address] Arlis Porta [place], [place]   [phone] or [phone]   [facility] [place], [place] [phone]   [facility] [address], [place], [place] [phone] Insurance/Medicaid/sponsorship through [facility] and Families [address]  [place]                                    [place], [place] [phone] [place] [address], [place] [phone]    Dr. Adele Schilder  [phone]   [facility] Dept. 1) [address], [place] 2) [place] 3)  [place] [address], [place] [phone]  [phone]   [place] [phone] or [phone] (After Hours)

## 2014-09-04 NOTE — ED Notes (Addendum)
Pt has been prescribed Lithium/Wellbutrin/Lexapro by an MD at [facility] in [place]. Has been off medications for "a while." Says his doctor is out of town and was told he would have to come see them to have medicine prescribed. Says,"My head hurts, I feel irritable and lightheaded. I feel super anxious. This all started about 2 days ago." Originally prescribed medications for bipolar disorder and depression. Denies SI/HI. Neurologically intact. A&Ox4. Ambulatory with steady gait.

## 2014-09-04 NOTE — ED Provider Notes (Signed)
CSN: [id number]     Arrival date & time 09/04/14  1436 History  This chart was scribed for non-physician practitioner, Santiago Glad, PA-C, working with Blake Divine, MD by Charline Bills, ED Scribe. This patient was seen in room WTR6/WTR6 and the patient's care was started at 3:18 PM.   Chief Complaint  Patient presents with  . Medication Refill   The history is provided by the patient. No language interpreter was used.   HPI Comments: Trevor Rubio is a 39 y.o. male who presents to the Emergency Department for a medication refill of Lithium, Wellbutrin and Lexapro. Pt states that he ran out of his medication 3 weeks ago while he was working out of town. He called to make an appointment at [facility] in [place] but his psychiatrist is currently on vacation and no other physician at the clinic will see him. He also called his PCP who did not feel comfortable starting him on medications that he has been out of for so long. Pt reports associated anxiety, irritability, restlessness and "feeling like he's floating". He denies HI.   PCP: Mila Palmer at [place]  Past Medical History  Diagnosis Date  . Sleep apnea    Past Surgical History  Procedure Laterality Date  . Uvulopalatopharyngoplasty    . Vasectomy     History reviewed. No pertinent family history. History  Substance Use Topics  . Smoking status: Never Smoker   . Smokeless tobacco: Not on file  . Alcohol Use: No    Review of Systems  Psychiatric/Behavioral: Positive for agitation. Negative for suicidal ideas. The patient is nervous/anxious.    Allergies  Review of patient's allergies indicates no known allergies.  Home Medications   Prior to Admission medications   Medication Sig Start Date End Date Taking? Authorizing Provider  clonazePAM (KLONOPIN) 0.5 MG tablet Take 0.5 mg by mouth daily.    Historical Provider, MD  escitalopram (LEXAPRO) 20 MG tablet Take 20 mg by mouth daily.    Historical  Provider, MD  HYDROcodone-acetaminophen (NORCO/VICODIN) 5-325 MG per tablet Take 1-2 tablets by mouth every 6 hours as needed for pain. 10/16/13   Nicole Pisciotta, PA-C  permethrin (ELIMITE) 5 % cream Apply to affected area head to toe and leave on for -14 hours and wash off with water. 07/29/14   Oswaldo Conroy, PA-C   BP 159/96 mmHg  Pulse 74  Temp(Src) 98.2 F (36.8 C) (Oral)  Resp 16  SpO2 100% Physical Exam  Constitutional: He is oriented to person, place, and time. He appears well-developed and well-nourished. No distress.  HENT:  Head: Normocephalic and atraumatic.  Eyes: Conjunctivae and EOM are normal.  Neck: Neck supple. No tracheal deviation present.  Cardiovascular: Normal rate.   Pulmonary/Chest: Effort normal. No respiratory distress.  Musculoskeletal: Normal range of motion.  Neurological: He is alert and oriented to person, place, and time.  Skin: Skin is warm and dry.  Psychiatric: His behavior is normal. His mood appears anxious. He expresses no homicidal and no suicidal ideation. He expresses no suicidal plans and no homicidal plans.  Nursing note and vitals reviewed.  ED Course  Procedures (including critical care time) DIAGNOSTIC STUDIES: Oxygen Saturation is 100% on RA, normal by my interpretation.    COORDINATION OF CARE: 3:24 PM-Discussed treatment plan which includes resource guide with pt at bedside and pt agreed to plan.   Labs Review Labs Reviewed - No data to display  Imaging Review No results found.   EKG Interpretation None  MDM   Final diagnoses:  None   Patient presents today requesting refills of his psychiatric medications that he has been off of for 3 weeks.  Patient told that he will need to go to his psychiatrist that prescribes the medication for refills, especially since he has been off of them for 3 weeks.  He denies SI or HI.  Feel that the patient is stable for discharge.  Return precautions given.     Santiago Glad,  PA-C 09/04/14 2252  Blake Divine, MD 09/07/14 [phone]

## 2014-09-04 NOTE — ED Notes (Signed)
Pt also reporting chest tightness. Reports that while at [facility] he was very agitated from everything that was going on.

## 2014-09-04 NOTE — ED Notes (Signed)
Tatyana, PA at bedside. 

## 2014-09-05 LAB — RAPID URINE DRUG SCREEN, HOSP PERFORMED
Amphetamines: NOT DETECTED
Barbiturates: NOT DETECTED
Benzodiazepines: NOT DETECTED
Cocaine: NOT DETECTED
Opiates: NOT DETECTED
Tetrahydrocannabinol: NOT DETECTED

## 2014-09-05 MED ORDER — DIAZEPAM 5 MG PO TABS: 5.0000 mg | ORAL_TABLET | Freq: Three times a day (TID) | ORAL | Status: DC | PRN

## 2014-09-05 MED ORDER — MECLIZINE HCL 50 MG PO TABS: 50.0000 mg | ORAL_TABLET | Freq: Three times a day (TID) | ORAL | Status: DC | PRN

## 2014-09-05 NOTE — Discharge Instructions (Signed)
Meclizine for dizziness. Valium if no relief with meclizine. Follow up with primary care doctor if not improving.    Benign Positional Vertigo Vertigo means you feel like you or your surroundings are moving when they are not. Benign positional vertigo is the most common form of vertigo. Benign means that the cause of your condition is not serious. Benign positional vertigo is more common in older adults. CAUSES  Benign positional vertigo is the result of an upset in the labyrinth system. This is an area in the middle ear that helps control your balance. This may be caused by a viral infection, head injury, or repetitive motion. However, often no specific cause is found. SYMPTOMS  Symptoms of benign positional vertigo occur when you move your head or eyes in different directions. Some of the symptoms may include:  Loss of balance and falls.  Vomiting.  Blurred vision.  Dizziness.  Nausea.  Involuntary eye movements (nystagmus). DIAGNOSIS  Benign positional vertigo is usually diagnosed by physical exam. If the specific cause of your benign positional vertigo is unknown, your caregiver may perform imaging tests, such as magnetic resonance imaging (MRI) or computed tomography (CT). TREATMENT  Your caregiver may recommend movements or procedures to correct the benign positional vertigo. Medicines such as meclizine, benzodiazepines, and medicines for nausea may be used to treat your symptoms. In rare cases, if your symptoms are caused by certain conditions that affect the inner ear, you may need surgery. HOME CARE INSTRUCTIONS   Follow your caregiver's instructions.  Move slowly. Do not make sudden body or head movements.  Avoid driving.  Avoid operating heavy machinery.  Avoid performing any tasks that would be dangerous to you or others during a vertigo episode.  Drink enough fluids to keep your urine clear or pale yellow. SEEK IMMEDIATE MEDICAL CARE IF:   You develop problems  with walking, weakness, numbness, or using your arms, hands, or legs.  You have difficulty speaking.  You develop severe headaches.  Your nausea or vomiting continues or gets worse.  You develop visual changes.  Your family or friends notice any behavioral changes.  Your condition gets worse.  You have a fever.  You develop a stiff neck or sensitivity to light. MAKE SURE YOU:   Understand these instructions.  Will watch your condition.  Will get help right away if you are not doing well or get worse. Document Released: 10/21/2005 Document Revised: 04/07/2011 Document Reviewed: 10/03/2010 [facility] Patient Information 2015 [place], [place]. This information is not intended to replace advice given to you by your health care provider. Make sure you discuss any questions you have with your health care provider.

## 2015-09-12 ENCOUNTER — Encounter: Payer: Self-pay | Admitting: Neurology

## 2015-09-12 ENCOUNTER — Ambulatory Visit (INDEPENDENT_AMBULATORY_CARE_PROVIDER_SITE_OTHER): Payer: BLUE CROSS/BLUE SHIELD | Admitting: Neurology

## 2015-09-12 VITALS — BP 118/78 | HR 64 | Wt 234.5 lb

## 2015-09-12 DIAGNOSIS — R635 Abnormal weight gain: Secondary | ICD-10-CM | POA: Diagnosis not present

## 2015-09-12 DIAGNOSIS — G4733 Obstructive sleep apnea (adult) (pediatric): Secondary | ICD-10-CM

## 2015-09-12 DIAGNOSIS — G4710 Hypersomnia, unspecified: Secondary | ICD-10-CM

## 2015-09-12 DIAGNOSIS — G4761 Periodic limb movement disorder: Secondary | ICD-10-CM | POA: Diagnosis not present

## 2015-09-12 NOTE — Progress Notes (Deleted)
Subjective:    Patient ID: Trevor Rubio is a 40 y.o. male.  HPI {Common ambulatory SmartLinks:[id number]}  Review of Systems  Objective:  Neurologic Exam  Physical Exam  Assessment:   ***  Plan:   ***

## 2015-09-12 NOTE — Patient Instructions (Signed)
Based on your symptoms and your exam I believe you are at risk for obstructive sleep apnea or OSA, and I think we should proceed with a sleep study to determine whether you do or do not have OSA and how severe it is. If you have more than mild OSA, I want you to consider treatment with CPAP. Please remember, the risks and ramifications of moderate to severe obstructive sleep apnea or OSA are: Cardiovascular disease, including congestive heart failure, stroke, difficult to control hypertension, arrhythmias, and even type 2 diabetes has been linked to untreated OSA. Sleep apnea causes disruption of sleep and sleep deprivation in most cases, which, in turn, can cause recurrent headaches, problems with memory, mood, concentration, focus, and vigilance. Most people with untreated sleep apnea report excessive daytime sleepiness, which can affect their ability to drive. Please do not drive if you feel sleepy.   I will likely see you back after your sleep study to go over the test results and where to go from there. We will call you after your sleep study to advise about the results (most likely, you will hear from Lafonda Mossesiana, my nurse) and to set up an appointment at the time, as necessary.    Our sleep lab administrative assistant, Alvis LemmingsDawn will meet with you or call you to schedule your sleep study. If you don't hear back from her by next week please feel free to call her at [phone]. This is her direct line and please leave a message with your phone number to call back if you get the voicemail box. She will call back as soon as possible.   Your sleepiness may in part be due to sedating medications.  We will also be on the look out for involuntary movements in your sleep during the sleep study.

## 2015-09-12 NOTE — Progress Notes (Signed)
Subjective:    Patient ID: Trevor Rubio is a 40 y.o. male.  HPI     Trevor FoleySaima Johannah Rozas, MD, PhD [facility] [address], [address] [address] [place], [place] [id number]  Dear Trevor AuLeslie,   I saw your patient, Trevor Rubio, upon your kind request in my neurologic clinic today for initial consultation of his sleep disorder, in particular reevaluation for obstructive sleep apnea. The patient is unaccompanied today. 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  I reviewed your office note from 08/27/2015, which you kindly included. He had a prior sleep study at [facility] on 04/23/2007, which I reviewed: Sleep efficiency was 69.5%, REM percentage 13.9% with a REM latency of 178.5 minutes, wake after sleep onset 81 minutes, AHI at 2.2 per hour, REM AHI 3.4 per hour, mild snoring was noted, average oxygen saturation was 94.3%, nadir was 88%. PLM index was 11 per hour. Weight at the time was 205 pounds, study interpreted by Dr. Jetty Duhamellinton Rubio. Currently at 234 lb, has been trying to lose weight, a month ago was 248 lb. He had TE and uvulectomy in 2009. He tried CPAP for about 4 weeks, this was before his sleep apnea surgery and then he had the PSG with above results. He had PSG prior to this, maybe in 2007 or '08 at [facility], which showed OSA, moderate per patient, results not available for my review. He has no FHx of OSA. He has nocturia about 2-3 times per night.  His Epworth sleepiness score is 17 out of 24 today, his fatigue score is 36 out of 63. He is a side sleeper, restless and has twitching movements in his sleep including leg movements in his sleep, has been told by his wife that he has apneic pauses.   His Past Medical History Is Significant  For: Past Medical History:  Diagnosis Date  . Bipolar depression (HCC)   . Sleep apnea    dx 2009    His Past Surgical History Is Significant For: Past Surgical History:  Procedure Laterality Date  . UVULOPALATOPHARYNGOPLASTY    . VASECTOMY      His Family History Is Significant For: Family History  Problem Relation Age of Onset  . Heart disease Maternal Uncle   . Lymphoma Paternal Grandfather     NHL   His Social History Is Significant For: Social History   Social History  . Marital status: Married    Spouse name: N/A  . Number of children: 2  . Years of education: 15   Occupational History  . [employer]    Social History Main Topics  . Smoking status: Former Smoker    Quit date: 03/19/2014  . Smokeless tobacco: Former NeurosurgeonUser    Quit date: 03/19/2014  . Alcohol use No     Comment: quit 03/19/14  . Drug use: No     Comment: quit 03/19/14  . Sexual activity: Not Asked   Other Topics Concern  . None   Social History Narrative   Lives with wife ,2 children   Caffeine 2-3 12 oz cans daily       His Allergies Are:  No Known Allergies:   His Current Medications Are:  Outpatient Encounter Prescriptions as of 09/12/2015  Medication Sig  . LATUDA 40 MG TABS  tablet 60 mg daily.  Marland Kitchen lithium 300 MG tablet 600 mg 2 (two) times daily.  . [DISCONTINUED] diazepam (VALIUM) 5 MG tablet Take 1 tablet (5 mg total) by mouth every 8 (eight) hours as needed (vertigo).  . [DISCONTINUED] meclizine (ANTIVERT) 50 MG tablet Take 1 tablet (50 mg total) by mouth 3 (three) times daily as needed.   No facility-administered encounter medications on file as of 09/12/2015.   :  Review of Systems:  Out of a complete 14 point review of systems, all are reviewed and negative with the exception of these symptoms as listed below: Review of Systems  Neurological:       Memory loss Sleep: Insomnia Snoring Restless leg Epworth Sleepiness Scale 0= would never doze 1= slight chance of dozing 2=  moderate chance of dozing 3= high chance of dozing  Sitting and reading:3 Watching TV:2 Sitting inactive in a public place (ex. Theater or meeting): 3 As a passenger in a car for an hour without a break: 3 Lying down to rest in the afternoon: 3 Sitting and talking to someone: 0 Sitting quietly after lunch (no alcohol): 3 In a car, while stopped in traffic: 0 Total: 17   Psychiatric/Behavioral: Positive for sleep disturbance.       Depression Not enough sleep Decreased energy Disinterest in activities    Objective:  Neurologic Exam  Physical Exam Physical Examination:   Vitals:   09/12/15 1429  BP: 118/78  Pulse: 64    General Examination: The patient is a very pleasant 40 y.o. male in no acute distress. He appears well-developed and well-nourished and well groomed.   HEENT: Normocephalic, atraumatic, pupils are equal, round and reactive to light and accommodation. Funduscopic exam is normal with sharp disc margins noted. Extraocular tracking is good without limitation to gaze excursion or nystagmus noted. Normal smooth pursuit is noted. Hearing is grossly intact. Tympanic membranes are clear bilaterally. Face is symmetric with normal facial animation and normal facial sensation. Speech is clear with no dysarthria noted. There is no hypophonia. There is no lip, neck/head, jaw or voice tremor. Neck is supple with full range of passive and active motion. There are no carotid bruits on auscultation. Oropharynx exam reveals: mild mouth dryness, adequate dental hygiene and mild airway crowding, due to smaller airway entry. He is s/p uvulectomy and tonsillectomy. Mallampati is class II. Tongue protrudes centrally and palate elevates symmetrically. Neck size is 20.25 inches. He has a Mild overbite.   Chest: Clear to auscultation without wheezing, rhonchi or crackles noted.  Heart: S1+S2+0, regular and normal without murmurs, rubs or gallops noted.   Abdomen: Soft, non-tender and  non-distended with normal bowel sounds appreciated on auscultation.  Extremities: There is no pitting edema in the distal lower extremities bilaterally. Pedal pulses are intact.  Skin: Warm and dry without trophic changes noted. There are no varicose veins.  Musculoskeletal: exam reveals no obvious joint deformities, tenderness or joint swelling or erythema.   Neurologically:  Mental status: The patient is awake, alert and oriented in all 4 spheres. His immediate and remote memory, attention, language skills and fund of knowledge are appropriate. There is no evidence of aphasia, agnosia, apraxia or anomia. Speech is clear with normal prosody and enunciation. Thought process is linear. Mood is normal and affect is normal.  Cranial nerves II - XII are as described above under HEENT exam. In addition: shoulder shrug is normal with equal shoulder height noted. Motor exam: Normal bulk, strength and tone is noted. There  is no drift, tremor or rebound. Romberg is negative. Reflexes are 2+ throughout. Fine motor skills and coordination: intact with normal finger taps, normal hand movements, normal rapid alternating patting, normal foot taps and normal foot agility.  Cerebellar testing: No dysmetria or intention tremor on finger to nose testing. Heel to shin is unremarkable bilaterally. There is no truncal or gait ataxia.  Sensory exam: intact to light touch, pinprick, vibration, temperature sense in the upper and lower extremities.  Gait, station and balance: He stands easily. No veering to one side is noted. No leaning to one side is noted. Posture is age-appropriate and stance is narrow based. Gait shows normal stride length and normal pace. No problems turning are noted. Tandem walk is unremarkable.               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 In addition, he has a Hx of PLMD. He is status post sleep apnea surgery after he was diagnosed with OSA in 2007 or 2008, but has had significant weight gain since his last sleep study in 2009. I had a long chat with the patient about my findings and the diagnosis of OSA, its prognosis and treatment options. We talked about medical treatments, surgical interventions and non-pharmacological approaches. I explained in particular the risks and ramifications of untreated moderate to severe OSA, especially with respect to developing cardiovascular disease down the Road, including congestive heart failure, difficult to treat hypertension, cardiac arrhythmias, or stroke. Even type 2 diabetes has, in part, been linked to untreated OSA. Symptoms of untreated OSA include daytime sleepiness, memory problems, mood irritability and mood disorder such as depression and anxiety, lack of energy, as well as recurrent headaches, especially morning headaches. We talked about trying to maintain a healthy lifestyle in general, as well as the importance of weight control. I encouraged the patient to eat healthy, exercise daily and keep well hydrated, to keep a scheduled bedtime and wake time routine, to not skip any meals and eat healthy snacks in between meals. I advised the patient not to drive when feeling sleepy. I recommended the following at this time: sleep study with potential positive airway pressure titration. (We will score hypopneas at 3% and split the sleep study into diagnostic and treatment portion, if the estimated. 2 hour AHI is >15/h).   I explained the sleep test procedure to the patient and also outlined possible surgical and non-surgical treatment options of OSA, including the use of a custom-made dental device (which would require a referral to a specialist dentist or oral surgeon), upper airway surgical  options, such as pillar implants, radiofrequency surgery, tongue base surgery, and UPPP (which would involve a referral to an ENT surgeon). Rarely, jaw surgery such as mandibular advancement may be considered.  I also explained the CPAP treatment option to the patient, who indicated that he would be willing to try CPAP if the need arises. I explained the importance of being compliant with PAP treatment, not only for insurance purposes but primarily to improve His symptoms, and for the patient's long term health benefit, including to reduce His cardiovascular risks. I answered all his questions today and the patient was in agreement. I would like to see him back after the sleep study  is completed and encouraged him to call with any interim questions, concerns, problems or updates.   Thank you very much for allowing me to participate in the care of this nice patient. If I can be of any further assistance to you please do not hesitate to call me at [phone].  Sincerely,   Star Age, MD, PhD

## 2016-04-18 ENCOUNTER — Encounter (HOSPITAL_COMMUNITY): Payer: Self-pay | Admitting: Emergency Medicine

## 2016-04-18 ENCOUNTER — Emergency Department (HOSPITAL_COMMUNITY): Payer: BLUE CROSS/BLUE SHIELD

## 2016-04-18 ENCOUNTER — Emergency Department (HOSPITAL_COMMUNITY)
Admission: EM | Admit: 2016-04-18 | Discharge: 2016-04-19 | Disposition: A | Discharge status: Home / Self Care | Payer: BLUE CROSS/BLUE SHIELD | Attending: Emergency Medicine | Admitting: Emergency Medicine

## 2016-04-18 DIAGNOSIS — R197 Diarrhea, unspecified: Secondary | ICD-10-CM | POA: Insufficient documentation

## 2016-04-18 DIAGNOSIS — Z87891 Personal history of nicotine dependence: Secondary | ICD-10-CM | POA: Diagnosis not present

## 2016-04-18 LAB — COMPREHENSIVE METABOLIC PANEL
ALT: 35 U/L (ref 17–63)
ANION GAP: 10 (ref 5–15)
AST: 20 U/L (ref 15–41)
Albumin: 4.1 g/dL (ref 3.5–5.0)
Alkaline Phosphatase: 93 U/L (ref 38–126)
BUN: 19 mg/dL (ref 6–20)
CO2: 22 mmol/L (ref 22–32)
Calcium: 9.3 mg/dL (ref 8.9–10.3)
Chloride: 104 mmol/L (ref 101–111)
Creatinine, Ser: 1.13 mg/dL (ref 0.61–1.24)
GFR calc non Af Amer: >60 mL/min (ref >60)
GLUCOSE: 104 mg/dL — AB (ref 65–99)
POTASSIUM: 4 mmol/L (ref 3.5–5.1)
SODIUM: 136 mmol/L (ref 135–145)
Total Bilirubin: 0.9 mg/dL (ref 0.3–1.2)
Total Protein: 7.1 g/dL (ref 6.5–8.1)

## 2016-04-18 LAB — LIPASE, BLOOD: Lipase: 16 U/L (ref 11–51)

## 2016-04-18 LAB — C DIFFICILE QUICK SCREEN W PCR REFLEX
C DIFFICILE (CDIFF) INTERP: NOT DETECTED
C Diff antigen: NEGATIVE
C Diff toxin: NEGATIVE

## 2016-04-18 LAB — CBC
HCT: 46.9 % (ref 39.0–52.0)
Hemoglobin: 16.5 g/dL (ref 13.0–17.0)
MCH: 30.4 pg (ref 26.0–34.0)
MCHC: 35.2 g/dL (ref 30.0–36.0)
MCV: 86.4 fL (ref 78.0–100.0)
Platelets: 377 10*3/uL (ref 150–400)
RBC: 5.43 MIL/uL (ref 4.22–5.81)
RDW: 12.4 % (ref 11.5–15.5)
WBC: 13.3 10*3/uL — ABNORMAL HIGH (ref 4.0–10.5)

## 2016-04-18 LAB — URINALYSIS, ROUTINE W REFLEX MICROSCOPIC
Bilirubin Urine: NEGATIVE
GLUCOSE, UA: NEGATIVE mg/dL
Hgb urine dipstick: NEGATIVE
KETONES UR: NEGATIVE mg/dL
Leukocytes, UA: NEGATIVE
Nitrite: NEGATIVE
PH: 5 (ref 5.0–8.0)
Protein, ur: NEGATIVE mg/dL
Specific Gravity, Urine: 1.024 (ref 1.005–1.030)

## 2016-04-18 MED ORDER — SODIUM CHLORIDE 0.9 % IV BOLUS (SEPSIS)
1000.0000 mL | Freq: Once | INTRAVENOUS | Status: AC
Start: 2016-04-18 — End: 2016-04-18
  Administered 2016-04-18: 1000 mL via INTRAVENOUS

## 2016-04-18 NOTE — ED Provider Notes (Signed)
[facility]-EMERGENCY DEPT Provider Note   CSN: [id number] Arrival date & time: 04/18/16  1555     History   Chief Complaint Chief Complaint  Patient presents with  . Abdominal Pain  . Emesis    HPI Trevor Rubio is a 41 y.o. male.  HPI  41 year old male with history of bipolar disorder who presents for evaluation of diarrhea that been occurring for the last week. 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 He states that he has had a cough on and off over the lat month, and has been on 2 different courses of penicillin for "bronchitis." States that shortly after finishing the second course of penicillin, he developed watery diarrhea that occurs approximately 5 times daily. States that he feels an instant urge to have a bowel movement immediately after eating or drinking. Reports abdominal cramping that is intermittent and not present currently. Cramping is usually present prior to having bowel movements. Endorses nausea but no vomiting. He had a fever a few days ago, but none since.  Additionally, patient endorses persistent cough. Cough is nonproductive, and he denies chest pain or shortness of breath. He was prescribed a course of prednisone and azithromycin by his PCP 5 days ago which he just finished yesterday. Reports some mild improvement in the cough, but states that it has not completely resolved.  Past Medical History:  Diagnosis Date  . Bipolar depression (HCC)   . Sleep apnea    dx 2009    There are no active problems to display for this patient.   Past Surgical History:  Procedure Laterality Date  . UVULOPALATOPHARYNGOPLASTY    . VASECTOMY         Home Medications    Prior to Admission medications   Medication Sig Start Date End Date Taking? Authorizing Provider  LATUDA 40 MG TABS tablet 60 mg daily. 08/13/15   Historical Provider, MD  lithium 300 MG tablet 600 mg 2 (two) times daily. 07/30/15    Historical Provider, MD    Family History Family History  Problem Relation Age of Onset  . Heart disease Maternal Uncle   . Lymphoma Paternal Grandfather     NHL    Social History Social History  Substance Use Topics  . Smoking status: Former Smoker    Quit date: 03/19/2014  . Smokeless tobacco: Former NeurosurgeonUser    Quit date: 03/19/2014  . Alcohol use No     Comment: quit 03/19/14     Allergies   Patient has no known allergies.   Review of Systems Review of Systems  Constitutional: Negative for chills and fever.  HENT: Negative for congestion, ear pain, rhinorrhea and sore throat.   Eyes: Negative for visual disturbance.  Respiratory: Positive for cough. Negative for shortness of breath and wheezing.   Cardiovascular: Negative for chest pain and palpitations.  Gastrointestinal: Positive for diarrhea. Negative for abdominal pain, blood in stool, nausea and vomiting.  Genitourinary: Negative for dysuria and frequency.  Musculoskeletal: Negative for arthralgias, back pain, myalgias and neck pain.  Skin: Negative for color change and rash.  Neurological: Negative for dizziness, syncope, weakness, light-headedness and headaches.  Psychiatric/Behavioral: Negative for agitation, behavioral problems and confusion.     Physical Exam Updated Vital Signs BP 122/79   Pulse 84   Temp 98.7 F (37.1 C) (Oral)   Resp 12   SpO2 95%   Physical Exam  Constitutional: He is oriented to person,  place, and time. He appears well-developed and well-nourished. No distress.  HENT:  Head: Normocephalic and atraumatic.  Right Ear: External ear normal.  Left Ear: External ear normal.  Eyes: Conjunctivae are normal.  Neck: Normal range of motion. Neck supple.  Cardiovascular: Normal rate, regular rhythm, normal heart sounds and intact distal pulses.   No murmur heard. Pulmonary/Chest: Effort normal and breath sounds normal. No respiratory distress. He has no wheezes. He has no rales.    Abdominal: Soft. Bowel sounds are normal. He exhibits no distension. There is no tenderness. There is no guarding.  Musculoskeletal: Normal range of motion. He exhibits no edema or tenderness.  Neurological: He is alert and oriented to person, place, and time. He exhibits normal muscle tone.  Skin: Skin is warm and dry. He is not diaphoretic.  Psychiatric: He has a normal mood and affect.  Nursing note and vitals reviewed.    ED Treatments / Results  Labs (all labs ordered are listed, but only abnormal results are displayed) Labs Reviewed  COMPREHENSIVE METABOLIC PANEL - Abnormal; Notable for the following:       Result Value   Glucose, Bld 104 (*)    All other components within normal limits  CBC - Abnormal; Notable for the following:    WBC 13.3 (*)    All other components within normal limits  URINALYSIS, ROUTINE W REFLEX MICROSCOPIC - Abnormal; Notable for the following:    APPearance HAZY (*)    All other components within normal limits  C DIFFICILE QUICK SCREEN W PCR REFLEX  GASTROINTESTINAL PANEL BY PCR, STOOL (REPLACES STOOL CULTURE)  LIPASE, BLOOD    EKG  EKG Interpretation None       Radiology Dg Chest 2 View  Result Date: 04/18/2016 CLINICAL DATA:  Abdominal pain, nausea, vomiting and diarrhea after working in [place] EXAM: CHEST  2 VIEW COMPARISON:  03/22/2007. FINDINGS: The heart size and mediastinal contours are normal. The lungs are clear. There is no pleural effusion or pneumothorax. No acute osseous findings are identified. IMPRESSION: Stable chest.  No active cardiopulmonary process. Electronically Signed   By: Carey Bullocks M.D.   On: 04/18/2016 20:38    Procedures Procedures (including critical care time)  Medications Ordered in ED Medications  sodium chloride 0.9 % bolus 1,000 mL (0 mLs Intravenous Stopped 04/18/16 2115)  sodium chloride 0.9 % bolus 1,000 mL (0 mLs Intravenous Stopped 04/18/16 2110)     Initial Impression / Assessment  and Plan / ED Course  I have reviewed the triage vital signs and the nursing notes.  Pertinent labs & imaging results that were available during my care of the patient were reviewed by me and considered in my medical decision making (see chart for details).     Generally well-appearing. Afebrile and hemodynamically stable. Abdominal exam benign as above. Does not appear dehydrated. Stool sample is partially formed and negative for C. difficile. GI pathogen panel sent and pending. Plan to contact patient by phone if results are positive.  CBC with mild leukocytosis to 13.3. CMP unremarkable and lipase is not elevated. Chest x-ray with no focal consolidation to suggest pneumonia and patient is satting well on room air.  Patient states that his son was recently suffering from suspected norovirus. This may be the cause of the patient's diarrhea. Recommend increasing fluid intake, and follow up with his primary care physician if diarrhea persists for another few days. No indication for antibiotics at this time. Patient discharged in stable condition.  Care of patient overseen by my attending, Dr. Eudelia Bunch.   Final Clinical Impressions(s) / ED Diagnoses   Final diagnoses:  Diarrhea, unspecified type    New Prescriptions Discharge Medication List as of 04/18/2016 11:48 PM       Marquise Lambson Ernestina Penna, MD 04/19/16 [phone]

## 2016-04-18 NOTE — ED Notes (Signed)
Patient transported to X-ray 

## 2016-04-18 NOTE — ED Triage Notes (Signed)
Pt working in [place] for 7 weeks. March 10th, pt developed abd pain, n/v, diarrhea. Pt states he cannot keep food down. Pt came [place] to [place] to seek treatment.

## 2016-04-19 ENCOUNTER — Telehealth (HOSPITAL_COMMUNITY): Payer: Self-pay

## 2016-04-19 LAB — GASTROINTESTINAL PANEL BY PCR, STOOL (REPLACES STOOL CULTURE)
ASTROVIRUS: NOT DETECTED
Adenovirus F40/41: NOT DETECTED
CYCLOSPORA CAYETANENSIS: NOT DETECTED
Campylobacter species: DETECTED — AB
Cryptosporidium: NOT DETECTED
ENTEROPATHOGENIC E COLI (EPEC): NOT DETECTED
Entamoeba histolytica: NOT DETECTED
Enteroaggregative E coli (EAEC): NOT DETECTED
Enterotoxigenic E coli (ETEC): NOT DETECTED
Giardia lamblia: NOT DETECTED
Norovirus GI/GII: NOT DETECTED
Plesimonas shigelloides: NOT DETECTED
Rotavirus A: DETECTED — AB
SALMONELLA SPECIES: NOT DETECTED
SHIGELLA/ENTEROINVASIVE E COLI (EIEC): NOT DETECTED
Sapovirus (I, II, IV, and V): NOT DETECTED
Shiga like toxin producing E coli (STEC): NOT DETECTED
VIBRIO SPECIES: NOT DETECTED
Vibrio cholerae: NOT DETECTED
Yersinia enterocolitica: NOT DETECTED

## 2016-04-19 NOTE — ED Provider Notes (Deleted)
Follow up GI panel   Trevor ConnPedro Rubio Trevor Delpriore, MD 04/19/16 [phone]

## 2016-04-19 NOTE — ED Provider Notes (Signed)
Patient's stool culture returned as positive for rotavirus and Campylobacter. I spoke with Dr. Ilsa IhaSnyder from infectious disease. She suggests call patient and see how doing. If doing well or improving. No treatment needed. If not doing well phone  prescription for azithromycin 500 mg daily 5 days. I've spoken with charge nurse. She will follow-up with patient   Doug SouSam Erique Kaser, MD 04/19/16 [phone]

## 2016-04-19 NOTE — ED Provider Notes (Signed)
I have personally seen and examined the patient. I have reviewed the documentation on PMH/FH/Soc Hx. I have discussed the plan of care with the resident and patient.  I have reviewed and agree with the resident's documentation. Please see associated encounter note.   EKG Interpretation None         Pedro Eduardo Cardama, MD 04/19/16 0001  

## 2016-12-12 ENCOUNTER — Emergency Department (HOSPITAL_COMMUNITY): Payer: Worker's Compensation

## 2016-12-12 ENCOUNTER — Other Ambulatory Visit: Payer: Self-pay

## 2016-12-12 ENCOUNTER — Emergency Department (HOSPITAL_COMMUNITY)
Admission: EM | Admit: 2016-12-12 | Discharge: 2016-12-12 | Disposition: A | Discharge status: Home / Self Care | Payer: Worker's Compensation | Attending: Emergency Medicine | Admitting: Emergency Medicine

## 2016-12-12 ENCOUNTER — Encounter (HOSPITAL_COMMUNITY): Payer: Self-pay

## 2016-12-12 DIAGNOSIS — Z79899 Other long term (current) drug therapy: Secondary | ICD-10-CM | POA: Diagnosis not present

## 2016-12-12 DIAGNOSIS — S161XXA Strain of muscle, fascia and tendon at neck level, initial encounter: Secondary | ICD-10-CM

## 2016-12-12 DIAGNOSIS — Z87891 Personal history of nicotine dependence: Secondary | ICD-10-CM | POA: Insufficient documentation

## 2016-12-12 DIAGNOSIS — Y9389 Activity, other specified: Secondary | ICD-10-CM | POA: Diagnosis not present

## 2016-12-12 DIAGNOSIS — R202 Paresthesia of skin: Secondary | ICD-10-CM | POA: Diagnosis not present

## 2016-12-12 DIAGNOSIS — M25531 Pain in right wrist: Secondary | ICD-10-CM | POA: Insufficient documentation

## 2016-12-12 DIAGNOSIS — Y999 Unspecified external cause status: Secondary | ICD-10-CM | POA: Insufficient documentation

## 2016-12-12 DIAGNOSIS — S199XXA Unspecified injury of neck, initial encounter: Secondary | ICD-10-CM | POA: Diagnosis present

## 2016-12-12 DIAGNOSIS — Y92410 Unspecified street and highway as the place of occurrence of the external cause: Secondary | ICD-10-CM | POA: Insufficient documentation

## 2016-12-12 LAB — CBC
HEMATOCRIT: 44.5 % (ref 39.0–52.0)
Hemoglobin: 15.6 g/dL (ref 13.0–17.0)
MCH: 31.1 pg (ref 26.0–34.0)
MCHC: 35.1 g/dL (ref 30.0–36.0)
MCV: 88.6 fL (ref 78.0–100.0)
PLATELETS: 263 10*3/uL (ref 150–400)
RBC: 5.02 MIL/uL (ref 4.22–5.81)
RDW: 12 % (ref 11.5–15.5)
WBC: 7 10*3/uL (ref 4.0–10.5)

## 2016-12-12 LAB — BASIC METABOLIC PANEL
Anion gap: 6 (ref 5–15)
BUN: 19 mg/dL (ref 6–20)
CALCIUM: 9.2 mg/dL (ref 8.9–10.3)
CO2: 23 mmol/L (ref 22–32)
Chloride: 107 mmol/L (ref 101–111)
Creatinine, Ser: 1.08 mg/dL (ref 0.61–1.24)
GFR calc Af Amer: >60 mL/min (ref >60)
GFR calc non Af Amer: >60 mL/min (ref >60)
GLUCOSE: 122 mg/dL — AB (ref 65–99)
Potassium: 4.2 mmol/L (ref 3.5–5.1)
Sodium: 136 mmol/L (ref 135–145)

## 2016-12-12 LAB — RAPID URINE DRUG SCREEN, HOSP PERFORMED
Amphetamines: POSITIVE — AB
BARBITURATES: NOT DETECTED
Benzodiazepines: NOT DETECTED
Cocaine: NOT DETECTED
Opiates: NOT DETECTED
TETRAHYDROCANNABINOL: NOT DETECTED

## 2016-12-12 MED ORDER — ONDANSETRON HCL 4 MG/2ML IJ SOLN
4.0000 mg | Freq: Once | INTRAMUSCULAR | Status: AC
  Administered 2016-12-12: 4 mg via INTRAVENOUS
  Filled 2016-12-12: qty 2

## 2016-12-12 MED ORDER — MORPHINE SULFATE (PF) 4 MG/ML IV SOLN: 6.0000 mg | Freq: Once | INTRAVENOUS | Status: DC

## 2016-12-12 MED ORDER — CYCLOBENZAPRINE HCL 10 MG PO TABS: 10.0000 mg | ORAL_TABLET | Freq: Two times a day (BID) | ORAL | 0 refills | Status: DC | PRN

## 2016-12-12 MED ORDER — METHYLPREDNISOLONE 4 MG PO TBPK: ORAL_TABLET | ORAL | 0 refills | Status: DC

## 2016-12-12 MED ORDER — MORPHINE SULFATE (PF) 4 MG/ML IV SOLN
4.0000 mg | Freq: Once | INTRAVENOUS | Status: AC
  Administered 2016-12-12: 4 mg via INTRAVENOUS
  Filled 2016-12-12: qty 1

## 2016-12-12 MED ORDER — KETOROLAC TROMETHAMINE 15 MG/ML IJ SOLN
15.0000 mg | Freq: Once | INTRAMUSCULAR | Status: AC
  Administered 2016-12-12: 15 mg via INTRAVENOUS
  Filled 2016-12-12: qty 1

## 2016-12-12 MED ORDER — ONDANSETRON HCL 4 MG/2ML IJ SOLN: 4.0000 mg | Freq: Once | INTRAMUSCULAR | Status: DC

## 2016-12-12 MED ORDER — DEXAMETHASONE SODIUM PHOSPHATE 10 MG/ML IJ SOLN
10.0000 mg | Freq: Once | INTRAMUSCULAR | Status: AC
  Administered 2016-12-12: 10 mg via INTRAVENOUS
  Filled 2016-12-12: qty 1

## 2016-12-12 NOTE — ED Notes (Signed)
Per MRI, pt to be transported to MRI within the next 30-60 mins. Will notify family.

## 2016-12-12 NOTE — ED Notes (Signed)
Pt in MRI at this time 
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
## 2016-12-12 NOTE — ED Notes (Signed)
Pt returned from MRI °
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
## 2016-12-22 ENCOUNTER — Encounter (HOSPITAL_COMMUNITY): Payer: Self-pay

## 2016-12-22 ENCOUNTER — Emergency Department (HOSPITAL_COMMUNITY): Payer: No Typology Code available for payment source

## 2016-12-22 ENCOUNTER — Other Ambulatory Visit: Payer: Self-pay

## 2016-12-22 ENCOUNTER — Emergency Department (HOSPITAL_COMMUNITY)
Admission: EM | Admit: 2016-12-22 | Discharge: 2016-12-22 | Disposition: A | Discharge status: Home / Self Care | Payer: No Typology Code available for payment source | Attending: Emergency Medicine | Admitting: Emergency Medicine

## 2016-12-22 DIAGNOSIS — Y999 Unspecified external cause status: Secondary | ICD-10-CM | POA: Insufficient documentation

## 2016-12-22 DIAGNOSIS — Y9389 Activity, other specified: Secondary | ICD-10-CM | POA: Diagnosis not present

## 2016-12-22 DIAGNOSIS — Z79899 Other long term (current) drug therapy: Secondary | ICD-10-CM | POA: Diagnosis not present

## 2016-12-22 DIAGNOSIS — S62002A Unspecified fracture of navicular [scaphoid] bone of left wrist, initial encounter for closed fracture: Secondary | ICD-10-CM | POA: Insufficient documentation

## 2016-12-22 DIAGNOSIS — Z87891 Personal history of nicotine dependence: Secondary | ICD-10-CM | POA: Insufficient documentation

## 2016-12-22 DIAGNOSIS — Y92410 Unspecified street and highway as the place of occurrence of the external cause: Secondary | ICD-10-CM | POA: Diagnosis not present

## 2016-12-22 DIAGNOSIS — S6992XA Unspecified injury of left wrist, hand and finger(s), initial encounter: Secondary | ICD-10-CM | POA: Diagnosis present

## 2016-12-22 NOTE — ED Notes (Signed)
Paged Ortho  

## 2016-12-22 NOTE — Progress Notes (Signed)
Orthopedic Tech Progress Note Patient Details:  Trevor QuietMichael A Rubio 03/09/1975 [id number]  Ortho Devices Type of Ortho Device: Ace wrap, Thumb spica splint, Arm sling Splint Material: Fiberglass Ortho Device/Splint Interventions: Application   Saul FordyceJennifer C Meekah Math 12/22/2016, 11:33 AM

## 2016-12-22 NOTE — Discharge Instructions (Signed)
Please read attached information. If you experience any new or worsening signs or symptoms please return to the emergency room for evaluation. Please follow-up with your primary care provider or specialist as discussed.  °

## 2016-12-22 NOTE — ED Notes (Signed)
Ortho at bedside.
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
## 2017-02-03 ENCOUNTER — Other Ambulatory Visit: Payer: Self-pay | Admitting: Orthopaedic Surgery

## 2017-02-03 DIAGNOSIS — S62002A Unspecified fracture of navicular [scaphoid] bone of left wrist, initial encounter for closed fracture: Secondary | ICD-10-CM

## 2017-02-06 ENCOUNTER — Other Ambulatory Visit: Payer: Self-pay

## 2017-12-28 ENCOUNTER — Other Ambulatory Visit: Payer: Self-pay

## 2017-12-28 ENCOUNTER — Encounter (HOSPITAL_COMMUNITY): Payer: Self-pay

## 2017-12-28 ENCOUNTER — Ambulatory Visit (HOSPITAL_COMMUNITY)
Admission: EM | Admit: 2017-12-28 | Discharge: 2017-12-28 | Disposition: A | Discharge status: Home / Self Care | Payer: BLUE CROSS/BLUE SHIELD | Attending: Family Medicine | Admitting: Family Medicine

## 2017-12-28 DIAGNOSIS — H9202 Otalgia, left ear: Secondary | ICD-10-CM | POA: Diagnosis not present

## 2017-12-28 MED ORDER — AMOXICILLIN-POT CLAVULANATE 875-125 MG PO TABS: 1.0000 | ORAL_TABLET | Freq: Two times a day (BID) | ORAL | 0 refills | Status: DC

## 2017-12-28 MED ORDER — OFLOXACIN 0.3 % OP SOLN: OPHTHALMIC | 0 refills | Status: DC

## 2017-12-28 MED ORDER — MELOXICAM 7.5 MG PO TABS: 7.5000 mg | ORAL_TABLET | Freq: Every day | ORAL | 0 refills | Status: AC

## 2017-12-28 NOTE — ED Provider Notes (Signed)
[facility]    CSN: [id number] Arrival date & time: 12/28/17  1801     History   Chief Complaint Chief Complaint  Patient presents with  . Otalgia    HPI Trevor Rubio is a 42 y.o. male.   42 year old male comes in for 1 day history of left ear pain.  States woke up this morning with the pain, has popping sensation and has had drainage.  No obvious decrease in hearing.  Pain can radiate down the left jaw.  No obvious URI symptoms such as cough, congestion, sore throat.  States had a temperature of 102 earlier today.     Past Medical History:  Diagnosis Date  . Bipolar depression (HCC)   . Sleep apnea    dx 2009    There are no active problems to display for this patient.   Past Surgical History:  Procedure Laterality Date  . UVULOPALATOPHARYNGOPLASTY    . VASECTOMY         Home Medications    Prior to Admission medications   Medication Sig Start Date End Date Taking? Authorizing Provider  amoxicillin-clavulanate (AUGMENTIN) 875-125 MG tablet Take 1 tablet by mouth every 12 (twelve) hours. 12/28/17   Cathie HoopsYu, Huan Pollok V, PA-C  cyclobenzaprine (FLEXERIL) 10 MG tablet Take 1 tablet (10 mg total) 2 (two) times daily as needed by mouth for muscle spasms. 12/12/16   Shaune PollackIsaacs, Cameron, MD  LATUDA 40 MG TABS tablet 60 mg daily. 08/13/15   [provider]  lithium 300 MG tablet 600 mg 2 (two) times daily. 07/30/15   [provider]  meloxicam (MOBIC) 7.5 MG tablet Take 1 tablet (7.5 mg total) by mouth daily. 12/28/17   Belinda FisherYu, Mirna Sutcliffe V, PA-C  methylPREDNISolone (MEDROL DOSEPAK) 4 MG TBPK tablet Take as directed on package 12/12/16   Shaune PollackIsaacs, Cameron, MD  ofloxacin (OCUFLOX) 0.3 % ophthalmic solution 10 drops in left ear daily for 6 days. 12/28/17   Belinda FisherYu, Yalonda Sample V, PA-C    Family History Family History  Problem Relation Age of Onset  . Heart disease Maternal Uncle   . Lymphoma Paternal Grandfather        NHL    Social History Social History   Tobacco Use  .  Smoking status: Former Smoker    Last attempt to quit: 03/19/2014    Years since quitting: 3.7  . Smokeless tobacco: Former NeurosurgeonUser    Quit date: 03/19/2014  Substance Use Topics  . Alcohol use: No    Comment: quit 03/19/14  . Drug use: No    Comment: quit 03/19/14     Allergies   Patient has no known allergies.   Review of Systems Review of Systems  Reason unable to perform ROS: See HPI as above.     Physical Exam Triage Vital Signs ED Triage Vitals  Enc Vitals Group     BP 12/28/17 1914 140/83     Pulse Rate 12/28/17 1914 100     Resp 12/28/17 1914 18     Temp 12/28/17 1914 100 F (37.8 C)     Temp Source 12/28/17 1914 Tympanic     SpO2 12/28/17 1914 98 %     Weight --      Height --      Head Circumference --      Peak Flow --      Pain Score 12/28/17 1913 10     Pain Loc --      Pain Edu? --  Excl. in GC? --    No data found.  Updated Vital Signs BP 140/83 (BP Location: Right Arm)   Pulse 100   Temp 100 F (37.8 C) (Tympanic)   Resp 18   SpO2 98%   Physical Exam  Constitutional: He is oriented to person, place, and time. He appears well-developed and well-nourished. No distress.  HENT:  Head: Normocephalic and atraumatic.  Right Ear: Tympanic membrane, external ear and ear canal normal. Tympanic membrane is not erythematous and not bulging.  Tenderness to palpation of left tragus. Dried crusting along ear canal opening. Drainage to the left ear canal blocking view of TM. No obvious ear canal swelling visualized.   Tried cotton swab to clean out drainage. Patient is very tender, unable to clear out all drainage. TM still not visible.  No tenderness to palpation of mastoid. No erythema, warmth.   Eyes: Pupils are equal, round, and reactive to light. Conjunctivae are normal.  Neurological: He is alert and oriented to person, place, and time.  Skin: He is not diaphoretic.     UC Treatments / Results  Labs (all labs ordered are listed, but only  abnormal results are displayed) Labs Reviewed - No data to display  EKG None  Radiology No results found.  Procedures Procedures (including critical care time)  Medications Ordered in UC Medications - No data to display  Initial Impression / Assessment and Plan / UC Course  I have reviewed the triage vital signs and the nursing notes.  Pertinent labs & imaging results that were available during my care of the patient were reviewed by me and considered in my medical decision making (see chart for details).    Discussed case with Dr. Delton See.  Will cover for otitis media as well as otitis externa due to excessive drainage.  Augmentin as directed.  Ofloxacin as directed.  Mobic for your pain.  Patient to follow-up with ENT this week for further evaluation and management needed.  Patient expresses understanding and agrees to plan.  Final Clinical Impressions(s) / UC Diagnoses   Final diagnoses:  Otalgia of left ear    ED Prescriptions    Medication Sig Dispense Auth. Provider   amoxicillin-clavulanate (AUGMENTIN) 875-125 MG tablet Take 1 tablet by mouth every 12 (twelve) hours. 14 tablet Jhett Fretwell V, PA-C   ofloxacin (OCUFLOX) 0.3 % ophthalmic solution 10 drops in left ear daily for 6 days. 5 mL Shantale Holtmeyer V, PA-C   meloxicam (MOBIC) 7.5 MG tablet Take 1 tablet (7.5 mg total) by mouth daily. 7 tablet Threasa Alpha, [place] 12/28/17 1955

## 2017-12-28 NOTE — Discharge Instructions (Signed)
Start augmentin as directed. Ofloxacin drops as directed. Start Mobic. Do not take ibuprofen (motrin/advil)/ naproxen (aleve) while on mobic. This may interact with your lithium, monitor for stomach upset, tremors, weakness, discontinue mobic and follow up with PCP for further evaluation needed. Otherwise, call ENT tomorrow for follow up this week.

## 2017-12-28 NOTE — ED Triage Notes (Signed)
Pt cc left ear discomfort  and drainage. This started today.

## 2017-12-31 DIAGNOSIS — H9012 Conductive hearing loss, unilateral, left ear, with unrestricted hearing on the contralateral side: Secondary | ICD-10-CM | POA: Insufficient documentation

## 2018-01-05 DIAGNOSIS — H6992 Unspecified Eustachian tube disorder, left ear: Secondary | ICD-10-CM | POA: Insufficient documentation

## 2018-01-05 DIAGNOSIS — H6502 Acute serous otitis media, left ear: Secondary | ICD-10-CM | POA: Insufficient documentation
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
## 2019-09-19 ENCOUNTER — Ambulatory Visit: Payer: 59 | Admitting: Physician Assistant

## 2019-09-19 DIAGNOSIS — Z0289 Encounter for other administrative examinations: Secondary | ICD-10-CM

## 2019-09-29 ENCOUNTER — Encounter: Payer: Self-pay | Admitting: Family Medicine

## 2021-03-19 DIAGNOSIS — J209 Acute bronchitis, unspecified: Secondary | ICD-10-CM | POA: Diagnosis not present

## 2021-04-09 DIAGNOSIS — R739 Hyperglycemia, unspecified: Secondary | ICD-10-CM | POA: Diagnosis not present

## 2021-04-09 DIAGNOSIS — E7800 Pure hypercholesterolemia, unspecified: Secondary | ICD-10-CM | POA: Diagnosis not present

## 2021-04-09 DIAGNOSIS — Z0000 Encounter for general adult medical examination without abnormal findings: Secondary | ICD-10-CM | POA: Diagnosis not present

## 2021-04-09 DIAGNOSIS — J45909 Unspecified asthma, uncomplicated: Secondary | ICD-10-CM | POA: Diagnosis not present

## 2021-04-09 DIAGNOSIS — Z125 Encounter for screening for malignant neoplasm of prostate: Secondary | ICD-10-CM | POA: Diagnosis not present

## 2021-04-09 DIAGNOSIS — Z79899 Other long term (current) drug therapy: Secondary | ICD-10-CM | POA: Diagnosis not present

## 2021-12-02 DIAGNOSIS — L7211 Pilar cyst: Secondary | ICD-10-CM | POA: Diagnosis not present

## 2022-01-01 DIAGNOSIS — L728 Other follicular cysts of the skin and subcutaneous tissue: Secondary | ICD-10-CM | POA: Diagnosis not present

## 2022-01-01 DIAGNOSIS — L7211 Pilar cyst: Secondary | ICD-10-CM | POA: Diagnosis not present

## 2022-01-01 DIAGNOSIS — R208 Other disturbances of skin sensation: Secondary | ICD-10-CM | POA: Diagnosis not present

## 2023-05-16 ENCOUNTER — Emergency Department (HOSPITAL_BASED_OUTPATIENT_CLINIC_OR_DEPARTMENT_OTHER)
Admission: EM | Admit: 2023-05-16 | Discharge: 2023-05-16 | Disposition: A | Discharge status: Home / Self Care | Attending: Emergency Medicine | Admitting: Emergency Medicine

## 2023-05-16 ENCOUNTER — Encounter (HOSPITAL_BASED_OUTPATIENT_CLINIC_OR_DEPARTMENT_OTHER): Payer: Self-pay

## 2023-05-16 ENCOUNTER — Emergency Department (HOSPITAL_BASED_OUTPATIENT_CLINIC_OR_DEPARTMENT_OTHER)

## 2023-05-16 ENCOUNTER — Other Ambulatory Visit: Payer: Self-pay

## 2023-05-16 DIAGNOSIS — N200 Calculus of kidney: Secondary | ICD-10-CM

## 2023-05-16 DIAGNOSIS — N132 Hydronephrosis with renal and ureteral calculous obstruction: Secondary | ICD-10-CM | POA: Diagnosis not present

## 2023-05-16 DIAGNOSIS — K760 Fatty (change of) liver, not elsewhere classified: Secondary | ICD-10-CM | POA: Diagnosis not present

## 2023-05-16 DIAGNOSIS — R1031 Right lower quadrant pain: Secondary | ICD-10-CM | POA: Diagnosis not present

## 2023-05-16 DIAGNOSIS — D72829 Elevated white blood cell count, unspecified: Secondary | ICD-10-CM | POA: Diagnosis not present

## 2023-05-16 DIAGNOSIS — N134 Hydroureter: Secondary | ICD-10-CM | POA: Diagnosis not present

## 2023-05-16 LAB — URINALYSIS, ROUTINE W REFLEX MICROSCOPIC
Bacteria, UA: NONE SEEN
Bilirubin Urine: NEGATIVE
Glucose, UA: NEGATIVE mg/dL
Ketones, ur: NEGATIVE mg/dL
Leukocytes,Ua: NEGATIVE
Nitrite: NEGATIVE
Specific Gravity, Urine: 1.035 — ABNORMAL HIGH (ref 1.005–1.030)
pH: 5 (ref 5.0–8.0)

## 2023-05-16 LAB — LIPASE, BLOOD: Lipase: 16 U/L (ref 11–51)

## 2023-05-16 LAB — CBC
HCT: 44.2 % (ref 39.0–52.0)
Hemoglobin: 15.8 g/dL (ref 13.0–17.0)
MCH: 30.6 pg (ref 26.0–34.0)
MCHC: 35.7 g/dL (ref 30.0–36.0)
MCV: 85.7 fL (ref 80.0–100.0)
Platelets: 273 10*3/uL (ref 150–400)
RBC: 5.16 MIL/uL (ref 4.22–5.81)
RDW: 11.5 % (ref 11.5–15.5)
WBC: 14.6 10*3/uL — ABNORMAL HIGH (ref 4.0–10.5)
nRBC: 0 % (ref 0.0–0.2)

## 2023-05-16 LAB — COMPREHENSIVE METABOLIC PANEL WITH GFR
ALT: 39 U/L (ref 0–44)
AST: 22 U/L (ref 15–41)
Albumin: 4.7 g/dL (ref 3.5–5.0)
Alkaline Phosphatase: 64 U/L (ref 38–126)
Anion gap: 9 (ref 5–15)
BUN: 26 mg/dL — ABNORMAL HIGH (ref 6–20)
CO2: 21 mmol/L — ABNORMAL LOW (ref 22–32)
Calcium: 9.8 mg/dL (ref 8.9–10.3)
Chloride: 104 mmol/L (ref 98–111)
Creatinine, Ser: 1.32 mg/dL — ABNORMAL HIGH (ref 0.61–1.24)
GFR, Estimated: >60 mL/min (ref >60)
Glucose, Bld: 138 mg/dL — ABNORMAL HIGH (ref 70–99)
Potassium: 4.3 mmol/L (ref 3.5–5.1)
Sodium: 134 mmol/L — ABNORMAL LOW (ref 135–145)
Total Bilirubin: 0.9 mg/dL (ref 0.0–1.2)
Total Protein: 6.8 g/dL (ref 6.5–8.1)

## 2023-05-16 MED ORDER — ONDANSETRON HCL 4 MG PO TABS: 4.0000 mg | ORAL_TABLET | Freq: Four times a day (QID) | ORAL | 0 refills | Status: DC

## 2023-05-16 MED ORDER — SODIUM CHLORIDE 0.9 % IV BOLUS
1000.0000 mL | Freq: Once | INTRAVENOUS | Status: AC
  Administered 2023-05-16: 1000 mL via INTRAVENOUS

## 2023-05-16 MED ORDER — TAMSULOSIN HCL 0.4 MG PO CAPS: 0.4000 mg | ORAL_CAPSULE | Freq: Every day | ORAL | 0 refills | Status: AC

## 2023-05-16 MED ORDER — OXYCODONE HCL 5 MG PO TABS: 5.0000 mg | ORAL_TABLET | Freq: Four times a day (QID) | ORAL | 0 refills | Status: AC | PRN

## 2023-05-16 MED ORDER — IOHEXOL 300 MG/ML  SOLN
100.0000 mL | Freq: Once | INTRAMUSCULAR | Status: AC | PRN
  Administered 2023-05-16: 100 mL via INTRAVENOUS

## 2023-05-16 MED ORDER — FENTANYL CITRATE PF 50 MCG/ML IJ SOSY
PREFILLED_SYRINGE | INTRAMUSCULAR | Status: AC
  Administered 2023-05-16: 50 ug via INTRAVENOUS
  Filled 2023-05-16: qty 1

## 2023-05-16 MED ORDER — ONDANSETRON HCL 4 MG/2ML IJ SOLN
4.0000 mg | Freq: Once | INTRAMUSCULAR | Status: DC
  Filled 2023-05-16: qty 2

## 2023-05-16 MED ORDER — MORPHINE SULFATE (PF) 4 MG/ML IV SOLN: 4.0000 mg | Freq: Once | INTRAVENOUS | Status: DC

## 2023-05-16 MED ORDER — KETOROLAC TROMETHAMINE 15 MG/ML IJ SOLN
15.0000 mg | Freq: Once | INTRAMUSCULAR | Status: AC
  Administered 2023-05-16: 15 mg via INTRAVENOUS
  Filled 2023-05-16: qty 1

## 2023-05-16 MED ORDER — MAGNESIUM SULFATE 2 GM/50ML IV SOLN
2.0000 g | Freq: Once | INTRAVENOUS | Status: AC
  Administered 2023-05-16: 2 g via INTRAVENOUS
  Filled 2023-05-16: qty 50

## 2023-05-16 MED ORDER — ONDANSETRON HCL 4 MG/2ML IJ SOLN
4.0000 mg | Freq: Once | INTRAMUSCULAR | Status: AC
  Administered 2023-05-16: 4 mg via INTRAVENOUS
  Filled 2023-05-16: qty 2

## 2023-05-16 MED ORDER — FENTANYL CITRATE PF 50 MCG/ML IJ SOSY
50.0000 ug | PREFILLED_SYRINGE | INTRAMUSCULAR | Status: AC | PRN
  Administered 2023-05-16: 50 ug via INTRAVENOUS
  Filled 2023-05-16: qty 1

## 2023-05-16 NOTE — ED Notes (Signed)
 Pt given water and saltine crackers for PO challenge. Tolerated well.

## 2023-05-16 NOTE — ED Triage Notes (Signed)
 Patient arrives with complaints of right side abdominal/flank pain x1 day. Rates pain a 10/10.

## 2023-05-16 NOTE — ED Notes (Signed)
 Pa AT bedside

## 2023-05-16 NOTE — ED Notes (Signed)
 Reviewed AVS/discharge instruction with patient. Time allotted for and all questions answered. Patient is agreeable for d/c and escorted to ed exit by staff.

## 2023-05-16 NOTE — ED Provider Notes (Signed)
 [facility] Provider Note   CSN: [id number] Arrival date & time: 05/16/23  1152     History  Chief Complaint  Patient presents with   Abdominal Pain   Flank Pain    right    Trevor Rubio is a 48 y.o. male with no pertinent past medical history presented for right lower flank pain.  Patient states he still has appendix and gallbladder.  Patient states he has not had a kidney stone in the past.  Patient denies any fevers.  Patient states that this all began this morning when he woke up at 9:00.  Patient states he has been able to urinate and have bowel movements but feels that he is constipated in the area as there is a pressure.  Patient states movement makes the pain worse.  Patient has had nausea without emesis.  Patient denies any dysuria or hematuria.  Home Medications Prior to Admission medications   Medication Sig Start Date End Date Taking? Authorizing Provider  ondansetron  (ZOFRAN ) 4 MG tablet Take 1 tablet (4 mg total) by mouth every 6 (six) hours. 05/16/23  Yes Brigitte Soderberg, Arlin Benes, PA-C  oxyCODONE  (ROXICODONE ) 5 MG immediate release tablet Take 1 tablet (5 mg total) by mouth every 6 (six) hours as needed for up to 3 days for severe pain (pain score 7-10). 05/16/23 05/19/23 Yes Ryot Burrous, Arlin Benes, PA-C  tamsulosin  (FLOMAX ) 0.4 MG CAPS capsule Take 1 capsule (0.4 mg total) by mouth daily after breakfast for 10 days. 05/16/23 05/26/23 Yes Camari Wisham, Arlin Benes, PA-C  amoxicillin -clavulanate (AUGMENTIN ) 875-125 MG tablet Take 1 tablet by mouth every 12 (twelve) hours. 12/28/17   Wilhelmenia Harada, Amy V, PA-C  cyclobenzaprine  (FLEXERIL ) 10 MG tablet Take 1 tablet (10 mg total) 2 (two) times daily as needed by mouth for muscle spasms. 12/12/16   Loman Risk, MD  LATUDA 40 MG TABS tablet 60 mg daily. 08/13/15   [provider]  lithium 300 MG tablet 600 mg 2 (two) times daily. 07/30/15   [provider]  meloxicam  (MOBIC ) 7.5 MG tablet Take 1 tablet  (7.5 mg total) by mouth daily. 12/28/17   Alfrieda Antes, PA-C  methylPREDNISolone  (MEDROL  DOSEPAK) 4 MG TBPK tablet Take as directed on package 12/12/16   Loman Risk, MD  ofloxacin  (OCUFLOX ) 0.3 % ophthalmic solution 10 drops in left ear daily for 6 days. 12/28/17   Alfrieda Antes, PA-C      Allergies    Patient has no known allergies.    Review of Systems   Review of Systems  Gastrointestinal:  Positive for abdominal pain.  Genitourinary:  Positive for flank pain.    Physical Exam Updated Vital Signs BP 137/86   Pulse 62   Temp 98.3 F (36.8 C) (Oral)   Resp 12   Ht 5\' 8"  (1.727 m)   Wt 102.1 kg   SpO2 95%   BMI 34.21 kg/m  Physical Exam Vitals reviewed.  Constitutional:      General: He is in acute distress.  HENT:     Head: Normocephalic and atraumatic.  Eyes:     Extraocular Movements: Extraocular movements intact.     Conjunctiva/sclera: Conjunctivae normal.     Pupils: Pupils are equal, round, and reactive to light.  Cardiovascular:     Rate and Rhythm: Normal rate and regular rhythm.     Pulses: Normal pulses.     Heart sounds: Normal heart sounds.     Comments: 2+ bilateral radial/dorsalis  pedis pulses with regular rate Pulmonary:     Effort: Pulmonary effort is normal. No respiratory distress.     Breath sounds: Normal breath sounds.  Abdominal:     Palpations: Abdomen is soft.     Tenderness: There is abdominal tenderness in the right lower quadrant. There is right CVA tenderness. There is no left CVA tenderness, guarding or rebound. Negative signs include Murphy's sign, Rovsing's sign, McBurney's sign and psoas sign.  Musculoskeletal:        General: Normal range of motion.     Cervical back: Normal range of motion and neck supple.     Comments: 5 out of 5 bilateral grip/leg extension strength  Skin:    General: Skin is warm and dry.     Capillary Refill: Capillary refill takes less than 2 seconds.  Neurological:     General: No focal deficit present.      Mental Status: He is alert and oriented to person, place, and time.     Comments: Sensation intact in all 4 limbs  Psychiatric:        Mood and Affect: Mood normal.     ED Results / Procedures / Treatments   Labs (all labs ordered are listed, but only abnormal results are displayed) Labs Reviewed  COMPREHENSIVE METABOLIC PANEL WITH GFR - Abnormal; Notable for the following components:      Result Value   Sodium 134 (*)    CO2 21 (*)    Glucose, Bld 138 (*)    BUN 26 (*)    Creatinine, Ser 1.32 (*)    All other components within normal limits  CBC - Abnormal; Notable for the following components:   WBC 14.6 (*)    All other components within normal limits  URINALYSIS, ROUTINE W REFLEX MICROSCOPIC - Abnormal; Notable for the following components:   Specific Gravity, Urine 1.035 (*)    Hgb urine dipstick MODERATE (*)    Protein, ur TRACE (*)    All other components within normal limits  LIPASE, BLOOD    EKG None  Radiology CT ABDOMEN PELVIS W CONTRAST Result Date: 05/16/2023 CLINICAL DATA:  Right lower quadrant pain EXAM: CT ABDOMEN AND PELVIS WITH CONTRAST TECHNIQUE: Multidetector CT imaging of the abdomen and pelvis was performed using the standard protocol following bolus administration of intravenous contrast. RADIATION DOSE REDUCTION: This exam was performed according to the departmental dose-optimization program which includes automated exposure control, adjustment of the mA and/or kV according to patient size and/or use of iterative reconstruction technique. CONTRAST:  OMNIPAQUE  IOHEXOL  300 MG/ML  SOLN COMPARISON:  None Available. FINDINGS: Lower chest: Lung bases are clear Hepatobiliary: Hepatic steatosis. No calcified gallstone or biliary dilatation Pancreas: Unremarkable. No pancreatic ductal dilatation or surrounding inflammatory changes. Spleen: Normal in size without focal abnormality. Adrenals/Urinary Tract: Adrenal glands are normal. Asymmetric right perinephric  stranding and mild hypoenhancement of right kidney compared to left. Mild right hydronephrosis and hydroureter, secondary to a 2-3 mm stone at the right UVJ. Decompressed urinary bladder Stomach/Bowel: Stomach is within normal limits. Appendix appears normal. No evidence of bowel wall thickening, distention, or inflammatory changes. Vascular/Lymphatic: Aortic atherosclerosis. No enlarged abdominal or pelvic lymph nodes. Reproductive: Prostate is unremarkable. Other: Negative for pelvic effusion or free air Musculoskeletal: No acute or suspicious osseous abnormality IMPRESSION: 1. Mild right hydronephrosis and hydroureter, secondary to a 2-3 mm stone at the right UVJ. 2. Negative for appendicitis 3. Hepatic steatosis. 4. Aortic atherosclerosis. Aortic Atherosclerosis (ICD10-I70.0). Electronically Signed  By: Esmeralda Hedge M.D.   On: 05/16/2023 16:14    Procedures Procedures    Medications Ordered in ED Medications  fentaNYL  (SUBLIMAZE ) injection 50 mcg (50 mcg Intravenous Given 05/16/23 1312)  ondansetron  (ZOFRAN ) injection 4 mg (4 mg Intravenous Given 05/16/23 1327)  iohexol  (OMNIPAQUE ) 300 MG/ML solution 100 mL (100 mLs Intravenous Contrast Given 05/16/23 1321)  ketorolac  (TORADOL ) 15 MG/ML injection 15 mg (15 mg Intravenous Given 05/16/23 1413)  magnesium  sulfate IVPB 2 g 50 mL (0 g Intravenous Stopped 05/16/23 1519)  sodium chloride  0.9 % bolus 1,000 mL (0 mLs Intravenous Stopped 05/16/23 1519)    ED Course/ Medical Decision Making/ A&P                                 Medical Decision Making Amount and/or Complexity of Data Reviewed Labs: ordered. Radiology: ordered.  Risk Prescription drug management.   Amadi Frady Deam 48 y.o. presented today for flank pain. Working DDx that I considered at this time includes, but not limited to, MSK, nephrolithiasis, pyelonephritis, AAA, aortic dissection, mesenteric ischemia, RCC, obstructive uropathy, renal infarct/hemorrhage, tumor, biliary colic,  pancreatitis, appendicitis, SBO, diverticulitis, shingles, lower lobe pneumonia, testicular torsion, epididymitis.  R/o DDx: MSK, pyelonephritis, AAA, aortic dissection, mesenteric ischemia, RCC, obstructive uropathy, renal infarct/hemorrhage, tumor, biliary colic, pancreatitis, appendicitis, SBO, diverticulitis, shingles, lower lobe pneumonia, testicular torsion, epididymitis: These are considered less likely due to history of present illness, physical exam, lab/imaging findings  Review of prior external notes: 01/01/2022 office visit  Unique Tests and My Independent Interpretation:  CBC: Leukocytosis 14.6 CMP: Unremarkable Lipase:Unremarkable UA: Moderate hemoglobin CT abdomen pelvis with contrast: 2 to 3 mm stone on the right ureter  Social Determinants of Health: none  Discussion with Independent Historian:  Wife  Discussion of Management of Tests: None  Risk: Medium: prescription drug management  Risk Stratification Score: None  EMERGENCY DEPARTMENT US  RENAL EXAM  "Study: Limited Retroperitoneal Ultrasound of Kidneys"  INDICATIONS: Flank pain Long and short axis of both kidneys were obtained.   PERFORMED BY: Myself IMAGES ARCHIVED?: Yes LIMITATIONS: Body habitus VIEWS USED: Long axis  INTERPRETATION: No Hydronephrosis, No Renal cyst, No Kidney stone  Plan: On exam patient was acute distress with stable vitals. Physical exam showed right CVA tenderness along with Some right lower quadrant tenderness as well without peritoneal signs.  Bedside ultrasound does not show any hydronephrosis however patient does know history of stone that has been sitting in his right kidney so we will get CT scan with contrast as he does have some abdominal tenderness as well to rule out appendicitis.  Patient given pain meds and did slightly improve.  CT scan by my independent to rotation does show stone with mild hydroureter.  I discussed with the patient following up with urology and straining  his urine.  I did discuss taking ibuprofen every 6 hours needed for pain but will prescribe oxycodone  for pain not controlled by this.  We discussed the side effects of this medication do not operate machinery or drive and uses only for pain not controlled by the ibuprofen to which patient agreed.  Will also prescribe Zofran  and Flomax .  I did discuss with the patient the Flomax  will make him little lightheaded as he will pee more so he will need to stay hydrated and be aware of this to which he agreed.  Patient's labs imaging are reassuring.  CT scan does confirm 2 to 3  mm stone on the right side causing his pain.  Patient is currently asymptomatic after receiving the Toradol  and magnesium  and is currently tolerating p.o. and is safe to be discharged.  We discussed return precautions and following up with urology to which she agreed.  Patient was given return precautions. Patient stable for discharge at this time.  Patient verbalized understanding of plan.  This chart was dictated using voice recognition software.  Despite best efforts to proofread,  errors can occur which can change the documentation meaning.         Final Clinical Impression(s) / ED Diagnoses Final diagnoses:  Nephrolithiasis    Rx / DC Orders ED Discharge Orders          Ordered    tamsulosin  (FLOMAX ) 0.4 MG CAPS capsule  Daily after breakfast        05/16/23 1451    oxyCODONE  (ROXICODONE ) 5 MG immediate release tablet  Every 6 hours PRN        05/16/23 1451    ondansetron  (ZOFRAN ) 4 MG tablet  Every 6 hours        05/16/23 1451              Elex Grimmer 05/16/23 1635    Arvilla Birmingham, MD 05/17/23 [phone]

## 2023-05-16 NOTE — ED Notes (Signed)
 Patient transported to CT

## 2023-05-16 NOTE — Discharge Instructions (Addendum)
 Today your labs and imaging show you have a kidney stone causing your symptoms. You have a 2-3 mm stone in the right side that should pass on its own. Currently, the symptoms are under control, and so we can safely discharge you. Most of the stones pass on their own, and we need to just control the pain. You may take Tylenol  or ibuprofen every 6 hours as needed for pain.  I have also prescribed you pain medication for pain not controlled by this. Please pick up the medications I have prescribed you, including the pain medicine. Call the Urologist for an appointment, if the pain continues - even if it is tolerable. Come to the ER if the pain is intolerable. Also come back to the ER if there are fevers, chills, inability to keep any fluids down, confusion, large blood clots passing.  Take Oxycodone  as prescribed. Do not drink alcohol, drive or participate in any other potentially dangerous activities while taking this medication as it may make you sleepy. Do not take this medication with any other sedating medications, either prescription or over-the-counter. If you were prescribed Percocet or Vicodin, do not take these with acetaminophen  (Tylenol ) as it is already contained within these medications.   This medication is an opiate (or narcotic) pain medication and can be habit forming.  Use it as little as possible to achieve adequate pain control.  Do not use or use it with extreme caution if you have a history of opiate abuse or dependence.  If you are on a pain contract with your primary care doctor or a pain specialist, be sure to let them know you were prescribed this medication today from the [facility].  This medication is intended for your use only - do not give any to anyone else and keep it in a secure place where nobody else, especially children, have access to it.  It will also cause or worsen constipation, so you may want to consider taking an over-the-counter stool softener  while you are taking this medication.

## 2023-12-17 DIAGNOSIS — M25571 Pain in right ankle and joints of right foot: Secondary | ICD-10-CM | POA: Diagnosis not present

## 2024-01-01 DIAGNOSIS — R051 Acute cough: Secondary | ICD-10-CM | POA: Diagnosis not present

## 2024-02-02 ENCOUNTER — Encounter: Payer: Self-pay | Admitting: Podiatry

## 2024-02-02 ENCOUNTER — Ambulatory Visit: Admitting: Podiatry

## 2024-02-02 ENCOUNTER — Ambulatory Visit (INDEPENDENT_AMBULATORY_CARE_PROVIDER_SITE_OTHER)

## 2024-02-02 DIAGNOSIS — J45909 Unspecified asthma, uncomplicated: Secondary | ICD-10-CM | POA: Insufficient documentation

## 2024-02-02 DIAGNOSIS — M722 Plantar fascial fibromatosis: Secondary | ICD-10-CM

## 2024-02-02 DIAGNOSIS — E66812 Obesity, class 2: Secondary | ICD-10-CM | POA: Insufficient documentation

## 2024-02-02 DIAGNOSIS — F39 Unspecified mood [affective] disorder: Secondary | ICD-10-CM | POA: Insufficient documentation

## 2024-02-02 DIAGNOSIS — R739 Hyperglycemia, unspecified: Secondary | ICD-10-CM | POA: Insufficient documentation

## 2024-02-02 DIAGNOSIS — E7800 Pure hypercholesterolemia, unspecified: Secondary | ICD-10-CM | POA: Insufficient documentation

## 2024-02-02 DIAGNOSIS — F319 Bipolar disorder, unspecified: Secondary | ICD-10-CM | POA: Insufficient documentation

## 2024-02-02 MED ORDER — MELOXICAM 15 MG PO TABS: 15.0000 mg | ORAL_TABLET | Freq: Every day | ORAL | 3 refills | Status: AC

## 2024-02-02 MED ORDER — METHYLPREDNISOLONE 4 MG PO TBPK: ORAL_TABLET | ORAL | 0 refills | Status: DC

## 2024-02-02 MED ORDER — TRIAMCINOLONE ACETONIDE 40 MG/ML IJ SUSP
20.0000 mg | Freq: Once | INTRAMUSCULAR | Status: AC
  Administered 2024-02-02: 20 mg

## 2024-02-02 NOTE — Progress Notes (Signed)
"  °  Subjective:  Patient ID: Trevor Rubio, male    DOB: Oct 24, 1975,  MRN: [id number] HPI Chief Complaint  Patient presents with   Foot Pain    Plantar heel left - aching x 2 months, AM pain, Telehealth visit-doc Rx'd prednisone-helped temp.   New Patient (Initial Visit)    49 y.o. male presents with the above complaint.   ROS: Denies fever chills nausea vomit muscle aches pains calf pain back pain chest pain shortness of breath.  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  Past Medical History:  Diagnosis Date   Bipolar depression (HCC)    Sleep apnea    dx 2009   Past Surgical History:  Procedure Laterality Date   UVULOPALATOPHARYNGOPLASTY     VASECTOMY     Current Medications[1]  Allergies[2] Review of Systems Objective:  There were no vitals filed for this visit.  General: Well developed, nourished, in no acute distress, alert and oriented x3   Dermatological: Skin is warm, dry and supple bilateral. Nails x 10 are well maintained; remaining integument appears unremarkable at this time. There are no open sores, no preulcerative lesions, no rash or signs of infection present.  Vascular: Dorsalis Pedis artery and Posterior Tibial artery pedal pulses are 2/4 bilateral with immedate capillary fill time. Pedal hair growth present. No varicosities and no lower extremity edema present bilateral.   Neruologic: Grossly intact via light touch bilateral. Vibratory intact via tuning fork bilateral. Protective threshold with Semmes Wienstein monofilament intact to all pedal sites bilateral. Patellar and Achilles deep tendon reflexes 2+ bilateral. No Babinski or clonus noted bilateral.   Musculoskeletal: No gross boney pedal deformities bilateral. No pain, crepitus, or limitation noted with foot and ankle range of motion bilateral. Muscular strength 5/5 in all groups tested bilateral.  Pain on palpation medial calcaneal tubercle of the left heel.  No pain on palpation of the lateral  calcaneal tubercle or the fourth fifth tarsometatarsal joint.  No pain on palpation of the Achilles.  Gait: Unassisted, Nonantalgic.    Radiographs:  Radiographs of the left foot demonstrate plantar distally oriented calcaneal spur today with soft tissue increase in density plantar fascial canny insertion site.  Otherwise osseously normal radiograph.  Assessment & Plan:   Assessment: Plantar fasciitis left x 6 months.  Plan: Injected the left heel today 20 mg Kenalog  5 mg Marcaine for maximal tenderness.  Tolerated procedure well without complications.  Discussed appropriate shoe gear stretching exercise ice therapy shoe gear modifications start him on methylprednisolone  to be followed by meloxicam .  I will follow-up with him in 1 month.     Adaleen Hulgan T. Silver Achey, DPM    [1]  Current Outpatient Medications:    meloxicam  (MOBIC ) 15 MG tablet, Take 1 tablet (15 mg total) by mouth daily., Disp: 30 tablet, Rfl: 3   methylPREDNISolone  (MEDROL  DOSEPAK) 4 MG TBPK tablet, 6 day dose pack - take as directed, Disp: 21 tablet, Rfl: 0 [2]  Allergies Allergen Reactions   Ropinirole     Other Reaction(s): irritable/insomnia   "

## 2024-02-08 ENCOUNTER — Encounter: Payer: Self-pay | Admitting: Podiatry

## 2024-03-01 ENCOUNTER — Ambulatory Visit: Admitting: Podiatry

## 2024-03-01 ENCOUNTER — Encounter: Payer: Self-pay | Admitting: Podiatry

## 2024-03-01 DIAGNOSIS — S93692A Other sprain of left foot, initial encounter: Secondary | ICD-10-CM

## 2024-03-02 NOTE — Progress Notes (Signed)
 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  States that nothing seems to be working including the steroid pack from previous physician as well as the injection from last time.  He states that it was good for about a day and then immediately came right back and seems to be the same if not worse.  He is referring to the pain in his left foot.  Objective: Vital signs are stable alert oriented x 3.  He has sharp pain on palpation medial calcaneal tubercle of the left heel.  The plantar fascia on dorsiflexion of the toes appears to be not very tight along that medial longitudinal arch and is painful with dorsiflexion.  Assessment: Probable tear of the plantar fascia particularly with no improvement with treatment.  Plan: This point requesting MRI of the left heel for plantar fascial tear.  Conservative therapies have failed to render him asymptomatic and most likely a tear or other pathologies are apparent here.  MRI should be able to help with differentiation and differential diagnosis as well as surgical planning.

## 2024-03-09 ENCOUNTER — Other Ambulatory Visit
